# Patient Record
Sex: Male | Born: 2000 | Race: Black or African American | Hispanic: No | Marital: Single | Smoking: Never smoker
Health system: Southern US, Community
[De-identification: ages and names within clinical notes are randomized; demographics above are authoritative.]

## PROBLEM LIST (undated history)

## (undated) DIAGNOSIS — Y249XXA Unspecified firearm discharge, undetermined intent, initial encounter: Secondary | ICD-10-CM

## (undated) DIAGNOSIS — F913 Oppositional defiant disorder: Secondary | ICD-10-CM

## (undated) DIAGNOSIS — W3400XA Accidental discharge from unspecified firearms or gun, initial encounter: Secondary | ICD-10-CM

---
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
## 2021-10-01 ENCOUNTER — Encounter (HOSPITAL_COMMUNITY): Payer: Self-pay | Admitting: *Deleted

## 2021-10-01 ENCOUNTER — Ambulatory Visit (HOSPITAL_COMMUNITY)
Admission: EM | Admit: 2021-10-01 | Discharge: 2021-10-01 | Disposition: A | Discharge status: Home / Self Care | Payer: Medicaid Other

## 2021-10-01 ENCOUNTER — Other Ambulatory Visit: Payer: Self-pay

## 2021-10-01 DIAGNOSIS — R100 Acute abdomen: Secondary | ICD-10-CM | POA: Diagnosis not present

## 2021-10-01 NOTE — ED Triage Notes (Signed)
Pt reports ABD pain and vomiting started this morning.
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
## 2021-10-01 NOTE — Discharge Instructions (Addendum)
-  I'm worried about appendicitis, which is potentially a surgical emergency. Head straight to the ED.

## 2021-10-17 ENCOUNTER — Ambulatory Visit (HOSPITAL_COMMUNITY)
Admission: EM | Admit: 2021-10-17 | Discharge: 2021-10-17 | Disposition: A | Discharge status: Home / Self Care | Payer: Medicaid Other | Attending: Family Medicine | Admitting: Family Medicine

## 2021-10-17 ENCOUNTER — Encounter (HOSPITAL_COMMUNITY): Payer: Self-pay

## 2021-10-17 DIAGNOSIS — R21 Rash and other nonspecific skin eruption: Secondary | ICD-10-CM | POA: Diagnosis not present

## 2021-10-17 DIAGNOSIS — Z113 Encounter for screening for infections with a predominantly sexual mode of transmission: Secondary | ICD-10-CM | POA: Diagnosis not present

## 2021-10-17 MED ORDER — CLOTRIMAZOLE 1 % EX CREA: TOPICAL_CREAM | CUTANEOUS | 0 refills | Status: DC

## 2021-10-17 NOTE — ED Triage Notes (Signed)
Pt presents for STI testing. He reports a itchy red rash x 2 days.

## 2021-10-17 NOTE — Discharge Instructions (Signed)
You were seen today for a rash.  I have sent out a cream for you to use twice/day.  You may also purchase over the counter cortisone cream to use on the rash sparingly, no more than twice/day x 3 days.  Your STD screening will be resulted tomorrow and you will be notified if any treatment is needed.
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
## 2021-10-18 LAB — CYTOLOGY, (ORAL, ANAL, URETHRAL) ANCILLARY ONLY
Chlamydia: NEGATIVE
Comment: NEGATIVE
Comment: NEGATIVE
Comment: NORMAL
Neisseria Gonorrhea: NEGATIVE
Trichomonas: NEGATIVE
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
## 2023-08-11 ENCOUNTER — Inpatient Hospital Stay (HOSPITAL_COMMUNITY): Payer: MEDICAID

## 2023-08-11 ENCOUNTER — Emergency Department (HOSPITAL_COMMUNITY): Payer: MEDICAID

## 2023-08-11 ENCOUNTER — Inpatient Hospital Stay (HOSPITAL_COMMUNITY)
Admission: EM | Admit: 2023-08-11 | Discharge: 2023-08-17 | DRG: 958 | Payer: MEDICAID | Attending: Surgery | Admitting: Surgery

## 2023-08-11 ENCOUNTER — Emergency Department (HOSPITAL_COMMUNITY): Payer: MEDICAID | Admitting: Anesthesiology

## 2023-08-11 ENCOUNTER — Other Ambulatory Visit: Payer: Self-pay

## 2023-08-11 ENCOUNTER — Encounter (HOSPITAL_COMMUNITY): Payer: Self-pay

## 2023-08-11 ENCOUNTER — Encounter (HOSPITAL_COMMUNITY): Admission: EM | Payer: Self-pay | Source: Home / Self Care

## 2023-08-11 DIAGNOSIS — S36118A Other injury of liver, initial encounter: Secondary | ICD-10-CM | POA: Diagnosis present

## 2023-08-11 DIAGNOSIS — D62 Acute posthemorrhagic anemia: Secondary | ICD-10-CM | POA: Diagnosis present

## 2023-08-11 DIAGNOSIS — S31132A Puncture wound of abdominal wall without foreign body, epigastric region without penetration into peritoneal cavity, initial encounter: Secondary | ICD-10-CM | POA: Diagnosis present

## 2023-08-11 DIAGNOSIS — S36593A Other injury of sigmoid colon, initial encounter: Principal | ICD-10-CM | POA: Diagnosis present

## 2023-08-11 DIAGNOSIS — W3400XA Accidental discharge from unspecified firearms or gun, initial encounter: Secondary | ICD-10-CM | POA: Diagnosis not present

## 2023-08-11 DIAGNOSIS — S31109A Unspecified open wound of abdominal wall, unspecified quadrant without penetration into peritoneal cavity, initial encounter: Principal | ICD-10-CM

## 2023-08-11 DIAGNOSIS — S32302A Unspecified fracture of left ilium, initial encounter for closed fracture: Secondary | ICD-10-CM | POA: Diagnosis present

## 2023-08-11 DIAGNOSIS — Y249XXA Unspecified firearm discharge, undetermined intent, initial encounter: Secondary | ICD-10-CM

## 2023-08-11 HISTORY — PX: COLOSTOMY: SHX63

## 2023-08-11 HISTORY — DX: Accidental discharge from unspecified firearms or gun, initial encounter: W34.00XA

## 2023-08-11 HISTORY — PX: COLON RESECTION SIGMOID: SHX6737

## 2023-08-11 HISTORY — PX: LAPAROTOMY: SHX154

## 2023-08-11 HISTORY — DX: Oppositional defiant disorder: F91.3

## 2023-08-11 HISTORY — DX: Unspecified firearm discharge, undetermined intent, initial encounter: Y24.9XXA

## 2023-08-11 LAB — CBC
HCT: 40.8 % (ref 39.0–52.0)
Hemoglobin: 13.4 g/dL (ref 13.0–17.0)
MCH: 30.5 pg (ref 26.0–34.0)
MCHC: 32.8 g/dL (ref 30.0–36.0)
MCV: 92.7 fL (ref 80.0–100.0)
Platelets: 462 10*3/uL — ABNORMAL HIGH (ref 150–400)
RBC: 4.4 MIL/uL (ref 4.22–5.81)
RDW: 11.1 % — ABNORMAL LOW (ref 11.5–15.5)
WBC: 16.2 10*3/uL — ABNORMAL HIGH (ref 4.0–10.5)
nRBC: 0 % (ref 0.0–0.2)

## 2023-08-11 LAB — COMPREHENSIVE METABOLIC PANEL
ALT: 18 U/L (ref 0–44)
AST: 40 U/L (ref 15–41)
Albumin: 3.9 g/dL (ref 3.5–5.0)
Alkaline Phosphatase: 63 U/L (ref 38–126)
Anion gap: 16 — ABNORMAL HIGH (ref 5–15)
BUN: 8 mg/dL (ref 6–20)
CO2: 18 mmol/L — ABNORMAL LOW (ref 22–32)
Calcium: 9 mg/dL (ref 8.9–10.3)
Chloride: 103 mmol/L (ref 98–111)
Creatinine, Ser: 1.03 mg/dL (ref 0.61–1.24)
GFR, Estimated: >60 mL/min (ref >60)
Glucose, Bld: 180 mg/dL — ABNORMAL HIGH (ref 70–99)
Potassium: 3.9 mmol/L (ref 3.5–5.1)
Sodium: 137 mmol/L (ref 135–145)
Total Bilirubin: 1.1 mg/dL (ref <1.2)
Total Protein: 6.6 g/dL (ref 6.5–8.1)

## 2023-08-11 LAB — I-STAT CG4 LACTIC ACID, ED: Lactic Acid, Venous: 7.9 mmol/L (ref 0.5–1.9)

## 2023-08-11 LAB — HIV ANTIBODY (ROUTINE TESTING W REFLEX): HIV Screen 4th Generation wRfx: NONREACTIVE

## 2023-08-11 LAB — POCT I-STAT EG7
Acid-base deficit: 3 mmol/L — ABNORMAL HIGH (ref 0.0–2.0)
Bicarbonate: 21.5 mmol/L (ref 20.0–28.0)
Calcium, Ion: 1.13 mmol/L — ABNORMAL LOW (ref 1.15–1.40)
HCT: 36 % — ABNORMAL LOW (ref 39.0–52.0)
Hemoglobin: 12.2 g/dL — ABNORMAL LOW (ref 13.0–17.0)
O2 Saturation: 99 %
Potassium: 4 mmol/L (ref 3.5–5.1)
Sodium: 138 mmol/L (ref 135–145)
TCO2: 23 mmol/L (ref 22–32)
pCO2, Ven: 36.7 mmHg — ABNORMAL LOW (ref 44–60)
pH, Ven: 7.376 (ref 7.25–7.43)
pO2, Ven: 155 mmHg — ABNORMAL HIGH (ref 32–45)

## 2023-08-11 LAB — ABO/RH: ABO/RH(D): AB POS

## 2023-08-11 LAB — CBG MONITORING, ED: Glucose-Capillary: 137 mg/dL — ABNORMAL HIGH (ref 70–99)

## 2023-08-11 LAB — I-STAT CHEM 8, ED
BUN: 9 mg/dL (ref 6–20)
Calcium, Ion: 1.01 mmol/L — ABNORMAL LOW (ref 1.15–1.40)
Chloride: 103 mmol/L (ref 98–111)
Creatinine, Ser: 0.9 mg/dL (ref 0.61–1.24)
Glucose, Bld: 180 mg/dL — ABNORMAL HIGH (ref 70–99)
HCT: 41 % (ref 39.0–52.0)
Hemoglobin: 13.9 g/dL (ref 13.0–17.0)
Potassium: 4.8 mmol/L (ref 3.5–5.1)
Sodium: 138 mmol/L (ref 135–145)
TCO2: 22 mmol/L (ref 22–32)

## 2023-08-11 LAB — PROTIME-INR
INR: 1.3 — ABNORMAL HIGH (ref 0.8–1.2)
Prothrombin Time: 15.9 s — ABNORMAL HIGH (ref 11.4–15.2)

## 2023-08-11 LAB — ETHANOL: Alcohol, Ethyl (B): <10 mg/dL (ref <10)

## 2023-08-11 SURGERY — LAPAROTOMY, EXPLORATORY
Anesthesia: General

## 2023-08-11 MED ORDER — HYDROMORPHONE HCL 1 MG/ML IJ SOLN
INTRAMUSCULAR | Status: AC
  Filled 2023-08-11: qty 1

## 2023-08-11 MED ORDER — SUCCINYLCHOLINE CHLORIDE 200 MG/10ML IV SOSY
PREFILLED_SYRINGE | INTRAVENOUS | Status: AC
  Filled 2023-08-11: qty 10

## 2023-08-11 MED ORDER — HYDROMORPHONE HCL 1 MG/ML IJ SOLN
INTRAMUSCULAR | Status: AC
  Filled 2023-08-11: qty 0.5

## 2023-08-11 MED ORDER — LIDOCAINE 2% (20 MG/ML) 5 ML SYRINGE
INTRAMUSCULAR | Status: DC | PRN
  Administered 2023-08-11: 100 mg via INTRAVENOUS

## 2023-08-11 MED ORDER — LIDOCAINE 2% (20 MG/ML) 5 ML SYRINGE
INTRAMUSCULAR | Status: AC
  Filled 2023-08-11: qty 5

## 2023-08-11 MED ORDER — ONDANSETRON HCL 4 MG/2ML IJ SOLN: 4.0000 mg | Freq: Once | INTRAMUSCULAR | Status: DC | PRN

## 2023-08-11 MED ORDER — PIPERACILLIN-TAZOBACTAM 3.375 G IVPB 30 MIN
3.3750 g | Freq: Once | INTRAVENOUS | Status: AC
  Administered 2023-08-11: 3.375 g via INTRAVENOUS

## 2023-08-11 MED ORDER — SODIUM CHLORIDE 0.9 % IV SOLN: INTRAVENOUS | Status: DC | PRN

## 2023-08-11 MED ORDER — METHOCARBAMOL 500 MG PO TABS
500.0000 mg | ORAL_TABLET | Freq: Three times a day (TID) | ORAL | Status: AC
  Administered 2023-08-13: 500 mg via ORAL
  Filled 2023-08-11: qty 1

## 2023-08-11 MED ORDER — IOHEXOL 350 MG/ML SOLN
75.0000 mL | Freq: Once | INTRAVENOUS | Status: AC | PRN
  Administered 2023-08-11: 75 mL via INTRAVENOUS

## 2023-08-11 MED ORDER — HYDROMORPHONE HCL 1 MG/ML IJ SOLN
0.2500 mg | INTRAMUSCULAR | Status: DC | PRN
  Administered 2023-08-11 (×4): 0.5 mg via INTRAVENOUS

## 2023-08-11 MED ORDER — PHENYLEPHRINE 80 MCG/ML (10ML) SYRINGE FOR IV PUSH (FOR BLOOD PRESSURE SUPPORT)
PREFILLED_SYRINGE | INTRAVENOUS | Status: DC | PRN
  Administered 2023-08-11 (×3): 160 ug via INTRAVENOUS

## 2023-08-11 MED ORDER — KETAMINE HCL 50 MG/5ML IJ SOSY
PREFILLED_SYRINGE | INTRAMUSCULAR | Status: DC | PRN
  Administered 2023-08-11: 30 mg via INTRAVENOUS
  Administered 2023-08-11: 10 mg via INTRAVENOUS

## 2023-08-11 MED ORDER — ONDANSETRON 4 MG PO TBDP: 4.0000 mg | ORAL_TABLET | Freq: Four times a day (QID) | ORAL | Status: DC | PRN

## 2023-08-11 MED ORDER — CARMEX CLASSIC LIP BALM EX OINT
1.0000 | TOPICAL_OINTMENT | Freq: Once | CUTANEOUS | Status: DC
  Filled 2023-08-11: qty 10

## 2023-08-11 MED ORDER — METHOCARBAMOL 1000 MG/10ML IJ SOLN
500.0000 mg | Freq: Three times a day (TID) | INTRAMUSCULAR | Status: AC
  Administered 2023-08-11 – 2023-08-14 (×8): 500 mg via INTRAVENOUS
  Filled 2023-08-11 (×9): qty 10

## 2023-08-11 MED ORDER — PROPOFOL 10 MG/ML IV BOLUS
INTRAVENOUS | Status: DC | PRN
  Administered 2023-08-11: 120 mg via INTRAVENOUS

## 2023-08-11 MED ORDER — FENTANYL CITRATE PF 50 MCG/ML IJ SOSY: 50.0000 ug | PREFILLED_SYRINGE | Freq: Once | INTRAMUSCULAR | Status: AC

## 2023-08-11 MED ORDER — CEFAZOLIN SODIUM-DEXTROSE 2-4 GM/100ML-% IV SOLN: 2.0000 g | Freq: Once | INTRAVENOUS | Status: DC

## 2023-08-11 MED ORDER — PIPERACILLIN-TAZOBACTAM 3.375 G IVPB
3.3750 g | Freq: Three times a day (TID) | INTRAVENOUS | Status: AC
  Administered 2023-08-11 – 2023-08-14 (×11): 3.375 g via INTRAVENOUS
  Filled 2023-08-11 (×11): qty 50

## 2023-08-11 MED ORDER — MORPHINE SULFATE (PF) 4 MG/ML IV SOLN
4.0000 mg | INTRAVENOUS | Status: DC | PRN
  Administered 2023-08-11 – 2023-08-13 (×10): 4 mg via INTRAVENOUS
  Filled 2023-08-11 (×10): qty 1

## 2023-08-11 MED ORDER — LACTATED RINGERS IV SOLN: INTRAVENOUS | Status: DC | PRN

## 2023-08-11 MED ORDER — MORPHINE SULFATE (PF) 2 MG/ML IV SOLN
2.0000 mg | INTRAVENOUS | Status: DC | PRN
  Administered 2023-08-13: 2 mg via INTRAVENOUS
  Filled 2023-08-11 (×2): qty 1

## 2023-08-11 MED ORDER — TETANUS-DIPHTH-ACELL PERTUSSIS 5-2.5-18.5 LF-MCG/0.5 IM SUSY: 0.5000 mL | PREFILLED_SYRINGE | Freq: Once | INTRAMUSCULAR | Status: DC

## 2023-08-11 MED ORDER — DEXAMETHASONE SODIUM PHOSPHATE 10 MG/ML IJ SOLN
INTRAMUSCULAR | Status: DC | PRN
  Administered 2023-08-11: 4 mg via INTRAVENOUS

## 2023-08-11 MED ORDER — KETOROLAC TROMETHAMINE 30 MG/ML IJ SOLN
30.0000 mg | Freq: Four times a day (QID) | INTRAMUSCULAR | Status: AC
  Administered 2023-08-11 – 2023-08-14 (×11): 30 mg via INTRAVENOUS
  Filled 2023-08-11 (×13): qty 1

## 2023-08-11 MED ORDER — SUGAMMADEX SODIUM 200 MG/2ML IV SOLN
INTRAVENOUS | Status: DC | PRN
  Administered 2023-08-11: 100 mg via INTRAVENOUS

## 2023-08-11 MED ORDER — ONDANSETRON HCL 4 MG/2ML IJ SOLN
4.0000 mg | Freq: Four times a day (QID) | INTRAMUSCULAR | Status: DC | PRN
  Administered 2023-08-11 – 2023-08-13 (×6): 4 mg via INTRAVENOUS
  Filled 2023-08-11 (×8): qty 2

## 2023-08-11 MED ORDER — METOPROLOL TARTRATE 5 MG/5ML IV SOLN: 5.0000 mg | Freq: Four times a day (QID) | INTRAVENOUS | Status: DC | PRN

## 2023-08-11 MED ORDER — PHENOL 1.4 % MT LIQD
1.0000 | OROMUCOSAL | Status: DC | PRN
  Administered 2023-08-11: 1 via OROMUCOSAL
  Filled 2023-08-11 (×2): qty 177

## 2023-08-11 MED ORDER — HYDRALAZINE HCL 20 MG/ML IJ SOLN: 10.0000 mg | INTRAMUSCULAR | Status: DC | PRN

## 2023-08-11 MED ORDER — MORPHINE SULFATE (PF) 2 MG/ML IV SOLN: 1.0000 mg | INTRAVENOUS | Status: DC | PRN

## 2023-08-11 MED ORDER — MIDAZOLAM HCL 2 MG/2ML IJ SOLN
INTRAMUSCULAR | Status: AC
  Filled 2023-08-11: qty 2

## 2023-08-11 MED ORDER — KCL IN DEXTROSE-NACL 20-5-0.45 MEQ/L-%-% IV SOLN
INTRAVENOUS | Status: DC
  Filled 2023-08-11 (×5): qty 1000

## 2023-08-11 MED ORDER — ENOXAPARIN SODIUM 30 MG/0.3ML IJ SOSY
30.0000 mg | PREFILLED_SYRINGE | Freq: Two times a day (BID) | INTRAMUSCULAR | Status: DC
  Filled 2023-08-11: qty 0.3

## 2023-08-11 MED ORDER — ACETAMINOPHEN 10 MG/ML IV SOLN
1000.0000 mg | Freq: Four times a day (QID) | INTRAVENOUS | Status: AC
  Administered 2023-08-11 – 2023-08-12 (×4): 1000 mg via INTRAVENOUS
  Filled 2023-08-11 (×4): qty 100

## 2023-08-11 MED ORDER — PANTOPRAZOLE SODIUM 40 MG IV SOLR
40.0000 mg | Freq: Every day | INTRAVENOUS | Status: DC
  Administered 2023-08-11 – 2023-08-14 (×4): 40 mg via INTRAVENOUS
  Filled 2023-08-11 (×4): qty 10

## 2023-08-11 MED ORDER — FENTANYL CITRATE PF 50 MCG/ML IJ SOSY
PREFILLED_SYRINGE | INTRAMUSCULAR | Status: AC
  Administered 2023-08-11: 50 ug via INTRAVENOUS
  Filled 2023-08-11: qty 1

## 2023-08-11 MED ORDER — MIDAZOLAM HCL 2 MG/2ML IJ SOLN
INTRAMUSCULAR | Status: DC | PRN
  Administered 2023-08-11: 2 mg via INTRAVENOUS

## 2023-08-11 MED ORDER — FENTANYL CITRATE (PF) 250 MCG/5ML IJ SOLN
INTRAMUSCULAR | Status: AC
  Filled 2023-08-11: qty 5

## 2023-08-11 MED ORDER — ONDANSETRON HCL 4 MG/2ML IJ SOLN
4.0000 mg | Freq: Once | INTRAMUSCULAR | Status: AC
  Administered 2023-08-11: 4 mg via INTRAVENOUS

## 2023-08-11 MED ORDER — ONDANSETRON HCL 4 MG/2ML IJ SOLN
INTRAMUSCULAR | Status: DC | PRN
  Administered 2023-08-11: 4 mg via INTRAVENOUS

## 2023-08-11 MED ORDER — PANTOPRAZOLE SODIUM 40 MG PO TBEC
40.0000 mg | DELAYED_RELEASE_TABLET | Freq: Every day | ORAL | Status: DC
  Administered 2023-08-15 – 2023-08-17 (×3): 40 mg via ORAL
  Filled 2023-08-11 (×4): qty 1

## 2023-08-11 MED ORDER — PHENYLEPHRINE 80 MCG/ML (10ML) SYRINGE FOR IV PUSH (FOR BLOOD PRESSURE SUPPORT)
PREFILLED_SYRINGE | INTRAVENOUS | Status: AC
  Filled 2023-08-11: qty 10

## 2023-08-11 MED ORDER — FENTANYL CITRATE (PF) 250 MCG/5ML IJ SOLN
INTRAMUSCULAR | Status: DC | PRN
  Administered 2023-08-11 (×2): 50 ug via INTRAVENOUS
  Administered 2023-08-11: 100 ug via INTRAVENOUS

## 2023-08-11 MED ORDER — SUCCINYLCHOLINE CHLORIDE 200 MG/10ML IV SOSY
PREFILLED_SYRINGE | INTRAVENOUS | Status: DC | PRN
  Administered 2023-08-11: 100 mg via INTRAVENOUS

## 2023-08-11 MED ORDER — ROCURONIUM BROMIDE 10 MG/ML (PF) SYRINGE
PREFILLED_SYRINGE | INTRAVENOUS | Status: DC | PRN
  Administered 2023-08-11: 40 mg via INTRAVENOUS

## 2023-08-11 MED ORDER — ORAL CARE MOUTH RINSE: 15.0000 mL | OROMUCOSAL | Status: DC | PRN

## 2023-08-11 MED ORDER — HYDROMORPHONE HCL 1 MG/ML IJ SOLN
INTRAMUSCULAR | Status: DC | PRN
  Administered 2023-08-11 (×2): .25 mg via INTRAVENOUS

## 2023-08-11 MED ORDER — PROPOFOL 10 MG/ML IV BOLUS
INTRAVENOUS | Status: AC
Start: 2023-08-11 — End: ?
  Filled 2023-08-11: qty 20

## 2023-08-11 MED ORDER — ONDANSETRON HCL 4 MG/2ML IJ SOLN
INTRAMUSCULAR | Status: AC
Start: 2023-08-11 — End: ?
  Filled 2023-08-11: qty 2

## 2023-08-11 MED ORDER — ROCURONIUM BROMIDE 10 MG/ML (PF) SYRINGE
PREFILLED_SYRINGE | INTRAVENOUS | Status: AC
Start: 2023-08-11 — End: ?
  Filled 2023-08-11: qty 10

## 2023-08-11 MED ORDER — ALBUMIN HUMAN 5 % IV SOLN
INTRAVENOUS | Status: DC | PRN
Start: 2023-08-11 — End: 2023-08-11

## 2023-08-11 MED ORDER — KETAMINE HCL 50 MG/5ML IJ SOSY
PREFILLED_SYRINGE | INTRAMUSCULAR | Status: AC
Start: 2023-08-11 — End: ?
  Filled 2023-08-11: qty 5

## 2023-08-11 SURGICAL SUPPLY — 41 items
BAG COUNTER SPONGE SURGICOUNT (BAG) ×2 IMPLANT
BLADE CLIPPER SURG (BLADE) IMPLANT
CANISTER SUCT 3000ML PPV (MISCELLANEOUS) ×2 IMPLANT
CHLORAPREP W/TINT 26 (MISCELLANEOUS) ×2 IMPLANT
COVER SURGICAL LIGHT HANDLE (MISCELLANEOUS) ×2 IMPLANT
DRAPE LAPAROSCOPIC ABDOMINAL (DRAPES) ×2 IMPLANT
DRAPE WARM FLUID 44X44 (DRAPES) ×2 IMPLANT
DRSG OPSITE POSTOP 4X10 (GAUZE/BANDAGES/DRESSINGS) IMPLANT
DRSG OPSITE POSTOP 4X8 (GAUZE/BANDAGES/DRESSINGS) IMPLANT
ELECT BLADE 6.5 EXT (BLADE) IMPLANT
ELECT CAUTERY BLADE 6.4 (BLADE) ×2 IMPLANT
ELECT REM PT RETURN 9FT ADLT (ELECTROSURGICAL) ×2 IMPLANT
ELECTRODE REM PT RTRN 9FT ADLT (ELECTROSURGICAL) ×2 IMPLANT
GLOVE BIO SURGEON STRL SZ8 (GLOVE) ×2 IMPLANT
GLOVE BIOGEL PI IND STRL 8 (GLOVE) ×2 IMPLANT
GOWN STRL REUS W/ TWL LRG LVL3 (GOWN DISPOSABLE) ×2 IMPLANT
GOWN STRL REUS W/ TWL XL LVL3 (GOWN DISPOSABLE) ×2 IMPLANT
HANDLE SUCTION POOLE (INSTRUMENTS) ×2 IMPLANT
KIT BASIN OR (CUSTOM PROCEDURE TRAY) ×2 IMPLANT
KIT OSTOMY DRAINABLE 2.75 STR (WOUND CARE) IMPLANT
KIT TURNOVER KIT B (KITS) ×2 IMPLANT
LIGASURE IMPACT 36 18CM CVD LR (INSTRUMENTS) IMPLANT
NS IRRIG 1000ML POUR BTL (IV SOLUTION) ×4 IMPLANT
PACK GENERAL/GYN (CUSTOM PROCEDURE TRAY) ×2 IMPLANT
PAD ARMBOARD 7.5X6 YLW CONV (MISCELLANEOUS) ×2 IMPLANT
RELOAD PROXIMATE 75MM BLUE (ENDOMECHANICALS) ×2 IMPLANT
RELOAD STAPLE 75 3.8 BLU REG (ENDOMECHANICALS) IMPLANT
SPECIMEN JAR LARGE (MISCELLANEOUS) IMPLANT
SPONGE T-LAP 18X18 ~~LOC~~+RFID (SPONGE) IMPLANT
STAPLER PROXIMATE 75MM BLUE (STAPLE) IMPLANT
STAPLER VISISTAT 35W (STAPLE) ×2 IMPLANT
SUCTION POOLE HANDLE (INSTRUMENTS) ×2 IMPLANT
SUT PDS AB 1 TP1 96 (SUTURE) ×4 IMPLANT
SUT SILK 2 0 SH CR/8 (SUTURE) ×2 IMPLANT
SUT SILK 2 0 TIES 10X30 (SUTURE) ×2 IMPLANT
SUT SILK 3 0 SH CR/8 (SUTURE) ×2 IMPLANT
SUT SILK 3 0 TIES 10X30 (SUTURE) ×2 IMPLANT
SUT VIC AB 3-0 SH 8-18 (SUTURE) IMPLANT
TOWEL GREEN STERILE (TOWEL DISPOSABLE) ×2 IMPLANT
TRAY FOLEY MTR SLVR 16FR STAT (SET/KITS/TRAYS/PACK) IMPLANT
YANKAUER SUCT BULB TIP NO VENT (SUCTIONS) IMPLANT
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
## 2023-08-11 NOTE — ED Notes (Signed)
Trauma end to OR at [phone]

## 2023-08-11 NOTE — Anesthesia Procedure Notes (Signed)
Procedure Name: Intubation Date/Time: 08/11/2023 8:58 AM  Performed by: Thomasene Ripple, CRNAPre-anesthesia Checklist: Patient identified, Emergency Drugs available, Suction available and Patient being monitored Patient Re-evaluated:Patient Re-evaluated prior to induction Oxygen Delivery Method: Circle System Utilized Preoxygenation: Pre-oxygenation with 100% oxygen Induction Type: IV induction Ventilation: Mask ventilation without difficulty Laryngoscope Size: Miller and 3 Grade View: Grade I Tube type: Oral Tube size: 8.0 mm Number of attempts: 1 Airway Equipment and Method: Stylet and Oral airway Placement Confirmation: ETT inserted through vocal cords under direct vision, positive ETCO2 and breath sounds checked- equal and bilateral Secured at: 23 cm Tube secured with: Tape Dental Injury: Teeth and Oropharynx as per pre-operative assessment
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
## 2023-08-11 NOTE — Transfer of Care (Signed)
Immediate Anesthesia Transfer of Care Note  Patient: Trevor [name]  Procedure(s) Performed: EXPLORATORY LAPAROTOMY COLOSTOMY COLON RESECTION SIGMOID  Patient Location: PACU  Anesthesia Type:General  Level of Consciousness: drowsy  Airway & Oxygen Therapy: Patient Spontanous Breathing and Patient connected to face mask oxygen  Post-op Assessment: Report given to RN and Post -op Vital signs reviewed and stable  Post vital signs: Reviewed and stable  Last Vitals:  Vitals Value Taken Time  BP 125/105 08/11/23 1034  Temp 36.4 C 08/11/23 1030  Pulse 138 08/11/23 1040  Resp 32 08/11/23 1040  SpO2 97 % 08/11/23 1040  Vitals shown include unfiled device data.  Last Pain:  Vitals:   08/11/23 1030  TempSrc:   PainSc: Asleep         Complications: No notable events documented.
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
## 2023-08-11 NOTE — ED Notes (Signed)
FAST exam Dr. Andria Meuse negative
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
## 2023-08-11 NOTE — Anesthesia Postprocedure Evaluation (Signed)
Anesthesia Post Note  Patient: Trevor [name]  Procedure(s) Performed: EXPLORATORY LAPAROTOMY COLOSTOMY COLON RESECTION SIGMOID     Patient location during evaluation: PACU Anesthesia Type: General Level of consciousness: awake and alert Pain management: pain level controlled Vital Signs Assessment: post-procedure vital signs reviewed and stable Respiratory status: spontaneous breathing, nonlabored ventilation, respiratory function stable and patient connected to nasal cannula oxygen Cardiovascular status: blood pressure returned to baseline and stable Postop Assessment: no apparent nausea or vomiting Anesthetic complications: no  No notable events documented.  Last Vitals:  Vitals:   08/11/23 1200 08/11/23 1215  BP: 135/82 121/81  Pulse: (!) 128 (!) 125  Resp: 20 18  Temp:    SpO2: 94% 94%    Last Pain:  Vitals:   08/11/23 1145  TempSrc:   PainSc: Asleep                 [name]
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
## 2023-08-11 NOTE — ED Notes (Addendum)
Detective on case Public relations account executive Gara Kroner [phone]
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
## 2023-08-11 NOTE — ED Notes (Signed)
BS = 137
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
## 2023-08-11 NOTE — ED Notes (Signed)
Blood sent to lab
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
## 2023-08-12 ENCOUNTER — Inpatient Hospital Stay (HOSPITAL_COMMUNITY): Payer: MEDICAID

## 2023-08-12 ENCOUNTER — Encounter (HOSPITAL_COMMUNITY): Payer: Self-pay | Admitting: General Surgery

## 2023-08-12 LAB — CBC
HCT: 35.1 % — ABNORMAL LOW (ref 39.0–52.0)
Hemoglobin: 11.7 g/dL — ABNORMAL LOW (ref 13.0–17.0)
MCH: 30.9 pg (ref 26.0–34.0)
MCHC: 33.3 g/dL (ref 30.0–36.0)
MCV: 92.6 fL (ref 80.0–100.0)
Platelets: 347 10*3/uL (ref 150–400)
RBC: 3.79 MIL/uL — ABNORMAL LOW (ref 4.22–5.81)
RDW: 11.4 % — ABNORMAL LOW (ref 11.5–15.5)
WBC: 20.3 10*3/uL — ABNORMAL HIGH (ref 4.0–10.5)
nRBC: 0 % (ref 0.0–0.2)

## 2023-08-12 LAB — BASIC METABOLIC PANEL
Anion gap: 8 (ref 5–15)
BUN: 8 mg/dL (ref 6–20)
CO2: 23 mmol/L (ref 22–32)
Calcium: 8.8 mg/dL — ABNORMAL LOW (ref 8.9–10.3)
Chloride: 104 mmol/L (ref 98–111)
Creatinine, Ser: 0.89 mg/dL (ref 0.61–1.24)
GFR, Estimated: >60 mL/min (ref >60)
Glucose, Bld: 141 mg/dL — ABNORMAL HIGH (ref 70–99)
Potassium: 4.2 mmol/L (ref 3.5–5.1)
Sodium: 135 mmol/L (ref 135–145)

## 2023-08-12 LAB — SURGICAL PATHOLOGY

## 2023-08-12 MED ORDER — PROCHLORPERAZINE EDISYLATE 10 MG/2ML IJ SOLN
10.0000 mg | Freq: Four times a day (QID) | INTRAMUSCULAR | Status: DC | PRN
  Administered 2023-08-12 (×3): 10 mg via INTRAVENOUS
  Filled 2023-08-12 (×3): qty 2

## 2023-08-12 MED ORDER — ENOXAPARIN SODIUM 30 MG/0.3ML IJ SOSY
30.0000 mg | PREFILLED_SYRINGE | Freq: Two times a day (BID) | INTRAMUSCULAR | Status: DC
  Administered 2023-08-13 – 2023-08-14 (×3): 30 mg via SUBCUTANEOUS
  Filled 2023-08-12 (×3): qty 0.3

## 2023-08-12 MED ORDER — SILVER NITRATE-POT NITRATE 75-25 % EX MISC
1.0000 | CUTANEOUS | Status: AC
  Administered 2023-08-12: 1 via TOPICAL
  Filled 2023-08-12: qty 1

## 2023-08-12 MED ORDER — ACETAMINOPHEN 10 MG/ML IV SOLN
1000.0000 mg | Freq: Four times a day (QID) | INTRAVENOUS | Status: AC
  Administered 2023-08-12 – 2023-08-13 (×4): 1000 mg via INTRAVENOUS
  Filled 2023-08-12 (×4): qty 100
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
## 2023-08-12 NOTE — Consult Note (Signed)
WOC Nurse ostomy consult note Stoma type/location: LLQ colostomy pink moist budded.  Nonproductive at this time.  Laparotomy was saturated and pouch was change by bedside RN not long ago this morning.  Patient is receiving assistance with bathing and linen change as they have serosanguinous effluent present Stomal assessment/size: 2" edematous pink and moist  Peristomal assessment: pouch intact  midline incision with moderate serosanguinous drainage Treatment options for stomal/peristomal skin: barrier ring and 1 piece flat pouch Output none at this time Ostomy pouching: 1pc. Pouch with barrier ring Education provided: Written materials left at bedside .  Will try pouch change again Friday.  Enrolled patient in [employer] DC program: no Will follow Mike Gip MSN, RN, FNP-BC [facility] Wound, Ostomy, Continence Nurse [facility]patient [facility] [phone] Pager [phone]
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
## 2023-08-13 LAB — CBC
HCT: 25.5 % — ABNORMAL LOW (ref 39.0–52.0)
Hemoglobin: 8.9 g/dL — ABNORMAL LOW (ref 13.0–17.0)
MCH: 31.1 pg (ref 26.0–34.0)
MCHC: 34.9 g/dL (ref 30.0–36.0)
MCV: 89.2 fL (ref 80.0–100.0)
Platelets: 289 10*3/uL (ref 150–400)
RBC: 2.86 MIL/uL — ABNORMAL LOW (ref 4.22–5.81)
RDW: 11.3 % — ABNORMAL LOW (ref 11.5–15.5)
WBC: 19 10*3/uL — ABNORMAL HIGH (ref 4.0–10.5)
nRBC: 0 % (ref 0.0–0.2)

## 2023-08-13 LAB — BASIC METABOLIC PANEL
Anion gap: 4 — ABNORMAL LOW (ref 5–15)
BUN: 8 mg/dL (ref 6–20)
CO2: 29 mmol/L (ref 22–32)
Calcium: 8.6 mg/dL — ABNORMAL LOW (ref 8.9–10.3)
Chloride: 99 mmol/L (ref 98–111)
Creatinine, Ser: 0.92 mg/dL (ref 0.61–1.24)
GFR, Estimated: >60 mL/min (ref >60)
Glucose, Bld: 128 mg/dL — ABNORMAL HIGH (ref 70–99)
Potassium: 4.3 mmol/L (ref 3.5–5.1)
Sodium: 132 mmol/L — ABNORMAL LOW (ref 135–145)

## 2023-08-13 MED ORDER — ACETAMINOPHEN 10 MG/ML IV SOLN
1000.0000 mg | Freq: Four times a day (QID) | INTRAVENOUS | Status: AC
  Administered 2023-08-13 – 2023-08-14 (×3): 1000 mg via INTRAVENOUS
  Filled 2023-08-13 (×4): qty 100

## 2023-08-13 MED ORDER — DEXTROSE-SODIUM CHLORIDE 5-0.9 % IV SOLN: INTRAVENOUS | Status: AC

## 2023-08-13 MED ORDER — BACITRACIN ZINC 500 UNIT/GM EX OINT: 1.0000 | TOPICAL_OINTMENT | Freq: Three times a day (TID) | CUTANEOUS | Status: DC | PRN

## 2023-08-13 MED ORDER — MELATONIN 3 MG PO TABS
3.0000 mg | ORAL_TABLET | Freq: Every evening | ORAL | Status: DC | PRN
  Administered 2023-08-13 – 2023-08-16 (×4): 3 mg via ORAL
  Filled 2023-08-13 (×4): qty 1
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
## 2023-08-14 LAB — CBC
HCT: 17.8 % — ABNORMAL LOW (ref 39.0–52.0)
HCT: 26.6 % — ABNORMAL LOW (ref 39.0–52.0)
Hemoglobin: 6.1 g/dL — CL (ref 13.0–17.0)
Hemoglobin: 9.1 g/dL — ABNORMAL LOW (ref 13.0–17.0)
MCH: 31 pg (ref 26.0–34.0)
MCH: 30.1 pg (ref 26.0–34.0)
MCHC: 34.3 g/dL (ref 30.0–36.0)
MCHC: 34.2 g/dL (ref 30.0–36.0)
MCV: 90.4 fL (ref 80.0–100.0)
MCV: 88.1 fL (ref 80.0–100.0)
Platelets: 235 10*3/uL (ref 150–400)
Platelets: 293 10*3/uL (ref 150–400)
RBC: 1.97 MIL/uL — ABNORMAL LOW (ref 4.22–5.81)
RBC: 3.02 MIL/uL — ABNORMAL LOW (ref 4.22–5.81)
RDW: 11.6 % (ref 11.5–15.5)
RDW: 13.5 % (ref 11.5–15.5)
WBC: 11.1 10*3/uL — ABNORMAL HIGH (ref 4.0–10.5)
WBC: 13 10*3/uL — ABNORMAL HIGH (ref 4.0–10.5)
nRBC: 0 % (ref 0.0–0.2)
nRBC: 0 % (ref 0.0–0.2)

## 2023-08-14 LAB — BASIC METABOLIC PANEL
Anion gap: 7 (ref 5–15)
BUN: 11 mg/dL (ref 6–20)
CO2: 24 mmol/L (ref 22–32)
Calcium: 7.8 mg/dL — ABNORMAL LOW (ref 8.9–10.3)
Chloride: 104 mmol/L (ref 98–111)
Creatinine, Ser: 0.81 mg/dL (ref 0.61–1.24)
GFR, Estimated: >60 mL/min (ref >60)
Glucose, Bld: 89 mg/dL (ref 70–99)
Potassium: 3.5 mmol/L (ref 3.5–5.1)
Sodium: 135 mmol/L (ref 135–145)

## 2023-08-14 LAB — PREPARE RBC (CROSSMATCH)

## 2023-08-14 MED ORDER — OXYCODONE HCL 5 MG PO TABS
5.0000 mg | ORAL_TABLET | ORAL | Status: DC | PRN
  Administered 2023-08-14 (×2): 5 mg via ORAL
  Administered 2023-08-15 – 2023-08-16 (×6): 10 mg via ORAL
  Filled 2023-08-14 (×7): qty 2
  Filled 2023-08-14: qty 1

## 2023-08-14 MED ORDER — METHOCARBAMOL 1000 MG/10ML IJ SOLN: 500.0000 mg | Freq: Three times a day (TID) | INTRAMUSCULAR | Status: DC

## 2023-08-14 MED ORDER — ACETAMINOPHEN 500 MG PO TABS
1000.0000 mg | ORAL_TABLET | Freq: Four times a day (QID) | ORAL | Status: DC
  Administered 2023-08-14 – 2023-08-17 (×10): 1000 mg via ORAL
  Filled 2023-08-14 (×11): qty 2

## 2023-08-14 MED ORDER — METHOCARBAMOL 500 MG PO TABS
500.0000 mg | ORAL_TABLET | Freq: Three times a day (TID) | ORAL | Status: AC
  Administered 2023-08-14 – 2023-08-17 (×8): 500 mg via ORAL
  Filled 2023-08-14 (×9): qty 1

## 2023-08-14 MED ORDER — MORPHINE SULFATE (PF) 2 MG/ML IV SOLN
2.0000 mg | INTRAVENOUS | Status: DC | PRN
  Administered 2023-08-14 – 2023-08-15 (×2): 2 mg via INTRAVENOUS
  Filled 2023-08-14 (×2): qty 1

## 2023-08-14 MED ORDER — SODIUM CHLORIDE 0.9% IV SOLUTION: Freq: Once | INTRAVENOUS | Status: DC

## 2023-08-14 MED ORDER — DEXTROSE-SODIUM CHLORIDE 5-0.9 % IV SOLN: INTRAVENOUS | Status: DC

## 2023-08-14 MED ORDER — SODIUM CHLORIDE 0.9% IV SOLUTION: Freq: Once | INTRAVENOUS | Status: AC
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
## 2023-08-14 NOTE — Progress Notes (Signed)
Date and time results received: 08/14/23 0628 (use smartphrase ".now" to insert current time)  Test: hgb Critical Value: 6.1  Name of Provider Notified: Andrey Campanile, MD  Orders Received? Or Actions Taken?: Orders for 1 unit PRBC
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
## 2023-08-14 NOTE — Consult Note (Addendum)
WOC Nurse ostomy follow up Stoma type/location: LLQ colostomy pink moist budded. Productive. Stomal assessment/size: 1 1/2 in. Pink and moisture. Peristomal assessment: Skin intact Treatment options for stomal/peristomal skin:  Output 80 ml Ostomy pouching: 1pc. Education provided: Teach how to cut the barrier, put the ring on the bag system, adjust into the skin, which position is better to him. Patient was participated on the changing. He close and up the pouch with any problem.  Empty pouch when 1/3  to  full of stool/flatus Clean bottom 2-inches of fecal pouch prior to resealing Assist patient in emptying pouch Change pouch twice weekly and PRN.  Write date of pouch application on pouch. Have spare pouch at bedside at all times Use Ostomy Teaching Booklet (provided by [facility] nurse) for teaching/reinforcing patient and caregiver education regarding ostomy Use Supplies:  Pouch One piece 2 1/8 in. LAWSON # [id number] Ring LAWSON # [id number] (in supply room)  Order supplies today 12/06. Ordered convex pouch for the next change.  Enrolled patient in [place] Secure Start Discharge program: Yes  Will going to change the pouch system on Tuesday.  Thank-you,  Denyse Amass BSN, RN, [employer], WOC  (Pager: [phone])

## 2023-08-14 NOTE — Progress Notes (Signed)
This RN went into patient room to explain the blood transfusion procedure and get consent, he stated "he don't just be signing stuff" after being educated on the importance of receiving blood products he still refused to sign at the [facility] like the doctor to speak to him Will pass along to dayshift RN

## 2023-08-15 LAB — CBC
HCT: 26.8 % — ABNORMAL LOW (ref 39.0–52.0)
Hemoglobin: 9.3 g/dL — ABNORMAL LOW (ref 13.0–17.0)
MCH: 30.5 pg (ref 26.0–34.0)
MCHC: 34.7 g/dL (ref 30.0–36.0)
MCV: 87.9 fL (ref 80.0–100.0)
Platelets: 250 10*3/uL (ref 150–400)
RBC: 3.05 MIL/uL — ABNORMAL LOW (ref 4.22–5.81)
RDW: 13.4 % (ref 11.5–15.5)
WBC: 10.1 10*3/uL (ref 4.0–10.5)
nRBC: 0 % (ref 0.0–0.2)

## 2023-08-15 LAB — BPAM RBC
Blood Product Expiration Date: 202412252359
ISSUE DATE / TIME: 202412060830
Unit Type and Rh: 6200

## 2023-08-15 LAB — BASIC METABOLIC PANEL
Anion gap: 9 (ref 5–15)
BUN: 10 mg/dL (ref 6–20)
CO2: 22 mmol/L (ref 22–32)
Calcium: 8.3 mg/dL — ABNORMAL LOW (ref 8.9–10.3)
Chloride: 104 mmol/L (ref 98–111)
Creatinine, Ser: 0.87 mg/dL (ref 0.61–1.24)
GFR, Estimated: >60 mL/min (ref >60)
Glucose, Bld: 83 mg/dL (ref 70–99)
Potassium: 3.4 mmol/L — ABNORMAL LOW (ref 3.5–5.1)
Sodium: 135 mmol/L (ref 135–145)

## 2023-08-15 LAB — TYPE AND SCREEN
ABO/RH(D): AB POS
Antibody Screen: NEGATIVE
Unit division: 0

## 2023-08-15 MED ORDER — POTASSIUM CHLORIDE CRYS ER 20 MEQ PO TBCR
40.0000 meq | EXTENDED_RELEASE_TABLET | Freq: Two times a day (BID) | ORAL | Status: AC
  Administered 2023-08-15 (×2): 40 meq via ORAL
  Filled 2023-08-15 (×2): qty 2
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
## 2023-08-16 LAB — BASIC METABOLIC PANEL
Anion gap: 10 (ref 5–15)
BUN: 6 mg/dL (ref 6–20)
CO2: 23 mmol/L (ref 22–32)
Calcium: 8.2 mg/dL — ABNORMAL LOW (ref 8.9–10.3)
Chloride: 101 mmol/L (ref 98–111)
Creatinine, Ser: 0.76 mg/dL (ref 0.61–1.24)
GFR, Estimated: >60 mL/min (ref >60)
Glucose, Bld: 80 mg/dL (ref 70–99)
Potassium: 4.1 mmol/L (ref 3.5–5.1)
Sodium: 134 mmol/L — ABNORMAL LOW (ref 135–145)

## 2023-08-16 LAB — CBC
HCT: 27.3 % — ABNORMAL LOW (ref 39.0–52.0)
Hemoglobin: 9.4 g/dL — ABNORMAL LOW (ref 13.0–17.0)
MCH: 30.3 pg (ref 26.0–34.0)
MCHC: 34.4 g/dL (ref 30.0–36.0)
MCV: 88.1 fL (ref 80.0–100.0)
Platelets: 326 10*3/uL (ref 150–400)
RBC: 3.1 MIL/uL — ABNORMAL LOW (ref 4.22–5.81)
RDW: 12.4 % (ref 11.5–15.5)
WBC: 11.1 10*3/uL — ABNORMAL HIGH (ref 4.0–10.5)
nRBC: 0.2 % (ref 0.0–0.2)
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
## 2023-08-17 ENCOUNTER — Encounter (HOSPITAL_COMMUNITY): Payer: Self-pay

## 2023-08-17 MED ORDER — ACETAMINOPHEN 500 MG PO TABS: 1000.0000 mg | ORAL_TABLET | Freq: Four times a day (QID) | ORAL | Status: AC | PRN

## 2023-08-17 MED ORDER — OXYCODONE HCL 5 MG PO TABS: 5.0000 mg | ORAL_TABLET | ORAL | 0 refills | Status: DC | PRN

## 2023-08-17 MED ORDER — METHOCARBAMOL 500 MG PO TABS: 500.0000 mg | ORAL_TABLET | Freq: Three times a day (TID) | ORAL | 0 refills | Status: DC | PRN

## 2023-08-17 NOTE — Consult Note (Signed)
WOC Nurse ostomy follow up Requested to teach further about the pouch system, Pt was alert and ready to have more instructions.  Stoma type/location: LLQ colostomy pink moist budded. Productive. Stomal assessment/size: 1 1/2 in. Pink and moisture. Peristomal assessment: Skin intact Treatment options for stomal/peristomal skin:  Output 50 ml Ostomy pouching: 1pc softconvex. Education provided: reviewed how to take it off without skin damage. Change the system each 3 or 5 days. Clean with water and soap. Measure and cut the barrier respecting size and shape of the ostomy, if the size change, measure again before cutting the new pouch.  Teach how to cut the barrier, Pt does not want to use the ring.  Patient was participated on the changing. He close and up the pouch with any problem.   Empty pouch when 1/3  to  full of stool/flatus Clean bottom 2-inches of fecal pouch prior to resealing Assist patient in emptying pouch Change pouch twice weekly and PRN.  Write date of pouch application on pouch. Have spare pouch at bedside at all times Presented the Ostomy Teaching Booklet (provided by [facility] nurse) for teaching/reinforcing patient and caregiver education regarding ostomy. Use Supplies:  Pouch One piece 2 1/8 in. LAWSON # [id number] (in supply room)   The patient will probably discharge tomorrow. If is not, the ostomy team will change the pouch on Friday.   Enrolled patient in [place] Secure Start Discharge program: Yes     Thank-you,  Denyse Amass BSN, RN, [facility], WOC  (Pager: [phone])
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
## 2023-08-17 NOTE — Discharge Instructions (Signed)
WOUND CARE: - midline dressing to be changed daily - supplies: sterile saline, gauze, scissors, tape  - remove dressing and all packing carefully, moistening with sterile saline as needed to avoid packing/internal dressing sticking to the wound. - clean edges of skin around the wound with water/gauze, making sure there is no tape debris or leakage left on skin that could cause skin irritation or breakdown. - dampen and clean gauze with sterile saline and pack wound from wound base to skin level, making sure to take note of any possible areas of wound tracking, tunneling and packing appropriately. Wound can be packed loosely. Trim gauze to size if a whole gauze is not required. - cover wound with a dry gauze and secure with tape.  - write the date/time on the dry dressing/tape to better track when the last dressing change occurred. - change dressing as needed if leakage occurs, wound gets contaminated, or patient requests to shower. - patient may shower daily with wound open (i.e. remove all packing) and following the shower the wound should be dried and a clean dressing placed.    [facility]      [facility], [place] [phone]  OPEN ABDOMINAL SURGERY: POST OP INSTRUCTIONS  Always review your discharge instruction sheet given to you by the facility where your surgery was performed.  IF YOU HAVE DISABILITY OR FAMILY LEAVE FORMS, YOU MUST BRING THEM TO THE OFFICE FOR PROCESSING.  PLEASE DO NOT GIVE THEM TO YOUR DOCTOR.  A prescription for pain medication may be given to you upon discharge.  Take your pain medication as prescribed, if needed.  If narcotic pain medicine is not needed, then you may take acetaminophen (Tylenol) or ibuprofen (Advil) as needed. Take your usually prescribed medications unless otherwise directed. If you need a refill on your pain medication, please contact your pharmacy. They will contact our office to request authorization.  Prescriptions will not be filled after  5pm or on week-ends. You should follow a light diet the first few days after arrival home, such as soup and crackers, pudding, etc.unless your doctor has advised otherwise. A high-fiber, low fat diet can be resumed as tolerated.   Be sure to include lots of fluids daily. Most patients will experience some swelling and bruising on the chest and neck area.  Ice packs will help.  Swelling and bruising can take several days to resolve Most patients will experience some swelling and bruising in the area of the incision. Ice pack will help. Swelling and bruising can take several days to resolve..  It is common to experience some constipation if taking pain medication after surgery.  Increasing fluid intake and taking a stool softener will usually help or prevent this problem from occurring.  A mild laxative (Milk of Magnesia or Miralax) should be taken according to package directions if there are no bowel movements after 48 hours.  You may have steri-strips (small skin tapes) in place directly over the incision.  These strips should be left on the skin for 7-10 days.  If your surgeon used skin glue on the incision, you may shower in 24 hours.  The glue will flake off over the next 2-3 weeks.  Any sutures or staples will be removed at the office during your follow-up visit. You may find that a light gauze bandage over your incision may keep your staples from being rubbed or pulled. You may shower and replace the bandage daily. ACTIVITIES:  You may resume regular (light) daily activities beginning the  next day--such as daily self-care, walking, climbing stairs--gradually increasing activities as tolerated.  You may have sexual intercourse when it is comfortable.  Refrain from any heavy lifting or straining until approved by your doctor. You may drive when you no longer are taking prescription pain medication, you can comfortably wear a seatbelt, and you can safely maneuver your car and apply brakes Return to Work:  ___________________________________ Trevor [name] should see your doctor in the office for a follow-up appointment approximately two weeks after your surgery.  Make sure that you call for this appointment within a day or two after you arrive home to insure a convenient appointment time. OTHER INSTRUCTIONS:  _____________________________________________________________ _____________________________________________________________  WHEN TO CALL YOUR DOCTOR: Fever over 101.0 Inability to urinate Nausea and/or vomiting Extreme swelling or bruising Continued bleeding from incision. Increased pain, redness, or drainage from the incision. Difficulty swallowing or breathing Muscle cramping or spasms. Numbness or tingling in hands or feet or around lips.  The clinic staff is available to answer your questions during regular business hours.  Please don't hesitate to call and ask to speak to one of the nurses if you have concerns.  For further questions, please visit [email or web address]     Managing Your Pain After Surgery Without Opioids    Thank you for participating in our program to help patients manage their pain after surgery without opioids. This is part of our effort to provide you with the best care possible, without exposing you or your family to the risk that opioids pose.  What pain can I expect after surgery? You can expect to have some pain after surgery. This is normal. The pain is typically worse the day after surgery, and quickly begins to get better. Many studies have found that many patients are able to manage their pain after surgery with Over-the-Counter (OTC) medications such as Tylenol and Motrin. If you have a condition that does not allow you to take Tylenol or Motrin, notify your surgical team.  How will I manage my pain? The best strategy for controlling your pain after surgery is around the clock pain control with Tylenol (acetaminophen) and Motrin (ibuprofen or  Advil). Alternating these medications with each other allows you to maximize your pain control. In addition to Tylenol and Motrin, you can use heating pads or ice packs on your incisions to help reduce your pain.  How will I alternate your regular strength over-the-counter pain medication? You will take a dose of pain medication every three hours. Start by taking 650 mg of Tylenol (2 pills of 325 mg) 3 hours later take 600 mg of Motrin (3 pills of 200 mg) 3 hours after taking the Motrin take 650 mg of Tylenol 3 hours after that take 600 mg of Motrin.   - 1 -  See example - if your first dose of Tylenol is at 12:00 PM   12:00 PM Tylenol 650 mg (2 pills of 325 mg)  3:00 PM Motrin 600 mg (3 pills of 200 mg)  6:00 PM Tylenol 650 mg (2 pills of 325 mg)  9:00 PM Motrin 600 mg (3 pills of 200 mg)  Continue alternating every 3 hours   We recommend that you follow this schedule around-the-clock for at least 3 days after surgery, or until you feel that it is no longer needed. Use the table on the last page of this handout to keep track of the medications you are taking. Important: Do not take more than 3000mg  of Tylenol or 3200mg  of  Motrin in a 24-hour period. Do not take ibuprofen/Motrin if you have a history of bleeding stomach ulcers, severe kidney disease, &/or actively taking a blood thinner  What if I still have pain? If you have pain that is not controlled with the over-the-counter pain medications (Tylenol and Motrin or Advil) you might have what we call "breakthrough" pain. You will receive a prescription for a small amount of an opioid pain medication such as Oxycodone, Tramadol, or Tylenol with Codeine. Use these opioid pills in the first 24 hours after surgery if you have breakthrough pain. Do not take more than 1 pill every 4-6 hours.  If you still have uncontrolled pain after using all opioid pills, don't hesitate to call our staff using the number provided. We will help make sure  you are managing your pain in the best way possible, and if necessary, we can provide a prescription for additional pain medication.   Day 1    Time  Name of Medication Number of pills taken  Amount of Acetaminophen  Pain Level   Comments  AM PM       AM PM       AM PM       AM PM       AM PM       AM PM       AM PM       AM PM       Total Daily amount of Acetaminophen Do not take more than  3,000 mg per day      Day 2    Time  Name of Medication Number of pills taken  Amount of Acetaminophen  Pain Level   Comments  AM PM       AM PM       AM PM       AM PM       AM PM       AM PM       AM PM       AM PM       Total Daily amount of Acetaminophen Do not take more than  3,000 mg per day      Day 3    Time  Name of Medication Number of pills taken  Amount of Acetaminophen  Pain Level   Comments  AM PM       AM PM       AM PM       AM PM         AM PM       AM PM       AM PM       AM PM       Total Daily amount of Acetaminophen Do not take more than  3,000 mg per day      Day 4    Time  Name of Medication Number of pills taken  Amount of Acetaminophen  Pain Level   Comments  AM PM       AM PM       AM PM       AM PM       AM PM       AM PM       AM PM       AM PM       Total Daily amount of Acetaminophen Do not take more than  3,000 mg per day      Day 5  Time  Name of Medication Number of pills taken  Amount of Acetaminophen  Pain Level   Comments  AM PM       AM PM       AM PM       AM PM       AM PM       AM PM       AM PM       AM PM       Total Daily amount of Acetaminophen Do not take more than  3,000 mg per day      Day 6    Time  Name of Medication Number of pills taken  Amount of Acetaminophen  Pain Level  Comments  AM PM       AM PM       AM PM       AM PM       AM PM       AM PM       AM PM       AM PM       Total Daily amount of Acetaminophen Do not take more than  3,000 mg per day       Day 7    Time  Name of Medication Number of pills taken  Amount of Acetaminophen  Pain Level   Comments  AM PM       AM PM       AM PM       AM PM       AM PM       AM PM       AM PM       AM PM       Total Daily amount of Acetaminophen Do not take more than  3,000 mg per day        For additional information about how and where to safely dispose of unused opioid medications - [email or web address]  Disclaimer: This document contains information and/or instructional materials adapted from [facility] for the typical patient with your condition. It does not replace medical advice from your health care provider because your experience may differ from that of the typical patient. Talk to your health care provider if you have any questions about this document, your condition or your treatment plan. Adapted from [facility]

## 2023-08-17 NOTE — Plan of Care (Signed)
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
## 2023-09-25 ENCOUNTER — Ambulatory Visit (HOSPITAL_COMMUNITY): Admission: EM | Admit: 2023-09-25 | Discharge: 2023-09-25 | Discharge status: Against Medical Advice | Payer: MEDICAID
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
## 2023-09-29 ENCOUNTER — Other Ambulatory Visit: Payer: Self-pay

## 2023-09-29 ENCOUNTER — Emergency Department (HOSPITAL_COMMUNITY)
Admission: EM | Admit: 2023-09-29 | Discharge: 2023-09-29 | Disposition: A | Discharge status: Home / Self Care | Payer: MEDICAID | Attending: Emergency Medicine | Admitting: Emergency Medicine

## 2023-09-29 ENCOUNTER — Encounter (HOSPITAL_COMMUNITY): Payer: Self-pay

## 2023-09-29 DIAGNOSIS — Z933 Colostomy status: Secondary | ICD-10-CM | POA: Insufficient documentation

## 2023-09-29 DIAGNOSIS — Z433 Encounter for attention to colostomy: Secondary | ICD-10-CM

## 2023-09-29 NOTE — ED Triage Notes (Signed)
Pt states he needs colostomy bags. Pt states he has an appointment tomorrow, but does not have enough supplies until then. Pt denies any other complaints at this time.

## 2023-09-29 NOTE — Discharge Instructions (Signed)
Please follow-up with your primary care provider regards recent and ER visit.  Today we changed out your colostomy bag we will need to follow-up with your primary care provider.  If symptoms change or worsen please return to ER.
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
## 2023-10-09 ENCOUNTER — Inpatient Hospital Stay (HOSPITAL_COMMUNITY): Admission: RE | Admit: 2023-10-09 | Payer: Self-pay | Source: Ambulatory Visit | Admitting: Nurse Practitioner

## 2023-10-12 ENCOUNTER — Other Ambulatory Visit: Payer: Self-pay

## 2023-10-12 ENCOUNTER — Emergency Department (HOSPITAL_COMMUNITY)
Admission: EM | Admit: 2023-10-12 | Discharge: 2023-10-12 | Disposition: A | Discharge status: Home / Self Care | Payer: MEDICAID | Attending: Emergency Medicine | Admitting: Emergency Medicine

## 2023-10-12 ENCOUNTER — Encounter (HOSPITAL_COMMUNITY): Payer: Self-pay

## 2023-10-12 DIAGNOSIS — R1084 Generalized abdominal pain: Secondary | ICD-10-CM | POA: Insufficient documentation

## 2023-10-12 LAB — COMPREHENSIVE METABOLIC PANEL
ALT: 13 U/L (ref 0–44)
AST: 24 U/L (ref 15–41)
Albumin: 4.6 g/dL (ref 3.5–5.0)
Alkaline Phosphatase: 61 U/L (ref 38–126)
Anion gap: 10 (ref 5–15)
BUN: 8 mg/dL (ref 6–20)
CO2: 26 mmol/L (ref 22–32)
Calcium: 9.7 mg/dL (ref 8.9–10.3)
Chloride: 102 mmol/L (ref 98–111)
Creatinine, Ser: 0.73 mg/dL (ref 0.61–1.24)
GFR, Estimated: >60 mL/min (ref >60)
Glucose, Bld: 90 mg/dL (ref 70–99)
Potassium: 4.2 mmol/L (ref 3.5–5.1)
Sodium: 138 mmol/L (ref 135–145)
Total Bilirubin: 0.7 mg/dL (ref 0.0–1.2)
Total Protein: 8.4 g/dL — ABNORMAL HIGH (ref 6.5–8.1)

## 2023-10-12 LAB — CBC WITH DIFFERENTIAL/PLATELET
Abs Immature Granulocytes: 0.01 10*3/uL (ref 0.00–0.07)
Basophils Absolute: 0 10*3/uL (ref 0.0–0.1)
Basophils Relative: 1 %
Eosinophils Absolute: 0.2 10*3/uL (ref 0.0–0.5)
Eosinophils Relative: 3 %
HCT: 45.7 % (ref 39.0–52.0)
Hemoglobin: 15.2 g/dL (ref 13.0–17.0)
Immature Granulocytes: 0 %
Lymphocytes Relative: 22 %
Lymphs Abs: 1.4 10*3/uL (ref 0.7–4.0)
MCH: 29.3 pg (ref 26.0–34.0)
MCHC: 33.3 g/dL (ref 30.0–36.0)
MCV: 88.2 fL (ref 80.0–100.0)
Monocytes Absolute: 0.4 10*3/uL (ref 0.1–1.0)
Monocytes Relative: 6 %
Neutro Abs: 4.5 10*3/uL (ref 1.7–7.7)
Neutrophils Relative %: 68 %
Platelets: 429 10*3/uL — ABNORMAL HIGH (ref 150–400)
RBC: 5.18 MIL/uL (ref 4.22–5.81)
RDW: 11.7 % (ref 11.5–15.5)
WBC: 6.5 10*3/uL (ref 4.0–10.5)
nRBC: 0 % (ref 0.0–0.2)

## 2023-10-12 LAB — LIPASE, BLOOD: Lipase: 23 U/L (ref 11–51)

## 2023-10-12 MED ORDER — ONDANSETRON HCL 4 MG/2ML IJ SOLN
4.0000 mg | Freq: Once | INTRAMUSCULAR | Status: AC
  Administered 2023-10-12: 4 mg via INTRAVENOUS
  Filled 2023-10-12: qty 2

## 2023-10-12 MED ORDER — OXYCODONE-ACETAMINOPHEN 5-325 MG PO TABS: 1.0000 | ORAL_TABLET | Freq: Four times a day (QID) | ORAL | 0 refills | Status: DC | PRN

## 2023-10-12 MED ORDER — ONDANSETRON HCL 4 MG PO TABS: 4.0000 mg | ORAL_TABLET | Freq: Three times a day (TID) | ORAL | 0 refills | Status: AC | PRN

## 2023-10-12 MED ORDER — FENTANYL CITRATE PF 50 MCG/ML IJ SOSY
50.0000 ug | PREFILLED_SYRINGE | Freq: Once | INTRAMUSCULAR | Status: AC
  Administered 2023-10-12: 50 ug via INTRAVENOUS
  Filled 2023-10-12: qty 1
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
## 2023-10-12 NOTE — ED Notes (Signed)
Pt has demonstrated ability to keep fluids down for last hour. States he is feeling less nauseous presently.

## 2023-10-13 ENCOUNTER — Ambulatory Visit (HOSPITAL_COMMUNITY)
Admission: RE | Admit: 2023-10-13 | Discharge: 2023-10-13 | Disposition: A | Discharge status: Home / Self Care | Payer: MEDICAID | Source: Ambulatory Visit | Attending: *Deleted | Admitting: *Deleted

## 2023-10-13 DIAGNOSIS — X58XXXA Exposure to other specified factors, initial encounter: Secondary | ICD-10-CM | POA: Diagnosis not present

## 2023-10-13 DIAGNOSIS — L24B3 Irritant contact dermatitis related to fecal or urinary stoma or fistula: Secondary | ICD-10-CM | POA: Diagnosis not present

## 2023-10-13 DIAGNOSIS — T8189XA Other complications of procedures, not elsewhere classified, initial encounter: Secondary | ICD-10-CM | POA: Diagnosis not present

## 2023-10-13 DIAGNOSIS — Z433 Encounter for attention to colostomy: Secondary | ICD-10-CM | POA: Diagnosis present

## 2023-10-13 NOTE — Discharge Instructions (Addendum)
Switching to 2 piece convex Use strip silver dressing to wound Dry dressing on top  change every other day.   Please email me with questions and with Medicaid information so I can set up with [facility] for supplies  [email or web address] 
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
## 2023-10-19 ENCOUNTER — Other Ambulatory Visit (HOSPITAL_COMMUNITY): Payer: Self-pay | Admitting: Nurse Practitioner

## 2023-10-19 DIAGNOSIS — Z433 Encounter for attention to colostomy: Secondary | ICD-10-CM | POA: Insufficient documentation

## 2023-10-19 DIAGNOSIS — L24B3 Irritant contact dermatitis related to fecal or urinary stoma or fistula: Secondary | ICD-10-CM | POA: Insufficient documentation

## 2023-10-19 DIAGNOSIS — T8189XA Other complications of procedures, not elsewhere classified, initial encounter: Secondary | ICD-10-CM | POA: Insufficient documentation

## 2023-11-29 ENCOUNTER — Emergency Department (HOSPITAL_COMMUNITY)
Admission: EM | Admit: 2023-11-29 | Discharge: 2023-11-29 | Disposition: A | Discharge status: Home / Self Care | Payer: MEDICAID | Attending: Emergency Medicine | Admitting: Emergency Medicine

## 2023-11-29 ENCOUNTER — Other Ambulatory Visit: Payer: Self-pay

## 2023-11-29 ENCOUNTER — Encounter (HOSPITAL_COMMUNITY): Payer: Self-pay | Admitting: *Deleted

## 2023-11-29 DIAGNOSIS — Z433 Encounter for attention to colostomy: Secondary | ICD-10-CM | POA: Diagnosis present
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
## 2023-11-29 NOTE — ED Notes (Signed)
 Patient discharged by this RN. Patient verbalizes understanding of instructions with no additional questions for this RN.

## 2023-11-29 NOTE — ED Triage Notes (Signed)
 The pt reports that he needs colostomy supplies that he always gets them here

## 2023-11-29 NOTE — Discharge Instructions (Signed)
 Follow up outpatient, Return for new or worsening symptoms

## 2024-01-06 ENCOUNTER — Ambulatory Visit: Payer: Self-pay | Admitting: General Surgery

## 2024-01-06 NOTE — Pre-Procedure Instructions (Signed)
 Surgical Instructions   Your procedure is scheduled on Jan 18, 2024. Report to [facility] Main Entrance "A" at 7:30 A.M., then check in with the Admitting office. Any questions or running late day of surgery: call [phone]  Questions prior to your surgery date: call [phone], Monday-Friday, 8am-4pm. If you experience any cold or flu symptoms such as cough, fever, chills, shortness of breath, etc. between now and your scheduled surgery, please notify us  at the above number.     Remember:  Do not eat after midnight the night before your surgery   You may drink clear liquids until 6:30 AM the morning of your surgery.   Clear liquids allowed are: Water, Non-Citrus Juices (without pulp), Carbonated Beverages, Clear Tea (no milk, honey, etc.), Black Coffee Only (NO MILK, CREAM OR POWDERED CREAMER of any kind), and Gatorade.  Patient Instructions  The night before surgery:  No food after midnight. ONLY clear liquids after midnight. Drink TWO (2) Pre-Surgery Ensure's the night prior to surgery.  The day of surgery (if you do NOT have diabetes):  Drink ONE (1) Pre-Surgery Clear Ensure by 6:30 AM the morning of surgery. Drink in one sitting. Do not sip.  This drink was given to you during your hospital  pre-op appointment visit.  Nothing else to drink after completing the  Pre-Surgery Clear Ensure.   Please follow the bowel prep instructions that you received from Dr. Velna Ghee office. If you have any questions about these instructions, you will need to contact his office.    Take these medicines the morning of surgery with A SIP OF WATER: NONE   May take these medicines IF NEEDED: acetaminophen  (TYLENOL )  methocarbamol  (ROBAXIN )  ondansetron  (ZOFRAN )    One week prior to surgery, STOP taking any Aspirin (unless otherwise instructed by your surgeon) Aleve, Naproxen, Ibuprofen , Motrin , Advil , Goody's, BC's, all herbal medications, fish oil, and non-prescription vitamins.                      Do NOT Smoke (Tobacco/Vaping) for 24 hours prior to your procedure.  If you use a CPAP at night, you may bring your mask/headgear for your overnight stay.   You will be asked to remove any contacts, glasses, piercing's, hearing aid's, dentures/partials prior to surgery. Please bring cases for these items if needed.    Patients discharged the day of surgery will not be allowed to drive home, and someone needs to stay with them for 24 hours.  SURGICAL WAITING ROOM VISITATION Patients may have no more than 2 support people in the waiting area - these visitors may rotate.   Pre-op nurse will coordinate an appropriate time for 1 ADULT support person, who may not rotate, to accompany patient in pre-op.  Children under the age of 40 must have an adult with them who is not the patient and must remain in the main waiting area with an adult.  If the patient needs to stay at the hospital during part of their recovery, the visitor guidelines for inpatient rooms apply.  Please refer to the [facility] website for the visitor guidelines for any additional information.   If you received a COVID test during your pre-op visit  it is requested that you wear a mask when out in public, stay away from anyone that may not be feeling well and notify your surgeon if you develop symptoms. If you have been in contact with anyone that has tested positive in the last 10 days please  notify you surgeon.      Pre-operative CHG Bathing Instructions   You can play a key role in reducing the risk of infection after surgery. Your skin needs to be as free of germs as possible. You can reduce the number of germs on your skin by washing with CHG (chlorhexidine  gluconate) soap before surgery. CHG is an antiseptic soap that kills germs and continues to kill germs even after washing.   DO NOT use if you have an allergy to chlorhexidine /CHG or antibacterial soaps. If your skin becomes reddened or irritated,  stop using the CHG and notify one of our RNs at [phone].              TAKE A SHOWER THE NIGHT BEFORE SURGERY AND THE DAY OF SURGERY    Please keep in mind the following:  DO NOT shave, including legs and underarms, 48 hours prior to surgery.   You may shave your face before/day of surgery.  Place clean sheets on your bed the night before surgery Use a clean washcloth (not used since being washed) for each shower. DO NOT sleep with pet's night before surgery.  CHG Shower Instructions:  Wash your face and private area with normal soap. If you choose to wash your hair, wash first with your normal shampoo.  After you use shampoo/soap, rinse your hair and body thoroughly to remove shampoo/soap residue.  Turn the water OFF and apply half the bottle of CHG soap to a CLEAN washcloth.  Apply CHG soap ONLY FROM YOUR NECK DOWN TO YOUR TOES (washing for 3-5 minutes)  DO NOT use CHG soap on face, private areas, open wounds, or sores.  Pay special attention to the area where your surgery is being performed.  If you are having back surgery, having someone wash your back for you may be helpful. Wait 2 minutes after CHG soap is applied, then you may rinse off the CHG soap.  Pat dry with a clean towel  Put on clean pajamas    Additional instructions for the day of surgery: DO NOT APPLY any lotions, deodorants, cologne, or perfumes.   Do not wear jewelry or makeup Do not wear nail polish, gel polish, artificial nails, or any other type of covering on natural nails (fingers and toes) Do not bring valuables to the hospital. [facility] is not responsible for valuables/personal belongings. Put on clean/comfortable clothes.  Please brush your teeth.  Ask your nurse before applying any prescription medications to the skin.

## 2024-01-07 ENCOUNTER — Encounter (HOSPITAL_COMMUNITY)
Admission: RE | Admit: 2024-01-07 | Discharge: 2024-01-07 | Disposition: A | Discharge status: Home / Self Care | Payer: MEDICAID | Source: Ambulatory Visit | Attending: General Surgery | Admitting: General Surgery

## 2024-01-07 ENCOUNTER — Other Ambulatory Visit: Payer: Self-pay

## 2024-01-07 ENCOUNTER — Encounter (HOSPITAL_COMMUNITY): Payer: Self-pay

## 2024-01-07 VITALS — BP 106/75 | HR 112 | Temp 98.2°F | Resp 17 | Ht 63.0 in | Wt 90.0 lb

## 2024-01-07 DIAGNOSIS — Z01812 Encounter for preprocedural laboratory examination: Secondary | ICD-10-CM | POA: Diagnosis present

## 2024-01-07 DIAGNOSIS — Z01818 Encounter for other preprocedural examination: Secondary | ICD-10-CM

## 2024-01-07 LAB — CBC
HCT: 39.2 % (ref 39.0–52.0)
Hemoglobin: 13 g/dL (ref 13.0–17.0)
MCH: 30.5 pg (ref 26.0–34.0)
MCHC: 33.2 g/dL (ref 30.0–36.0)
MCV: 92 fL (ref 80.0–100.0)
Platelets: 450 10*3/uL — ABNORMAL HIGH (ref 150–400)
RBC: 4.26 MIL/uL (ref 4.22–5.81)
RDW: 12.2 % (ref 11.5–15.5)
WBC: 6.5 10*3/uL (ref 4.0–10.5)
nRBC: 0 % (ref 0.0–0.2)

## 2024-01-07 NOTE — Progress Notes (Signed)
 PCP - No PCP Cardiologist - Denies  PPM/ICD - Denies Device Orders - n/a Rep Notified - n/a  Chest x-ray - 08/11/2023 EKG - Denies Stress Test - Denies ECHO - Denies Cardiac Cath - Denies  Sleep Study - Denies CPAP - n/a  No DM  Last dose of GLP1 agonist- n/a GLP1 instructions: n/a  Blood Thinner Instructions: n/a Aspirin Instructions: n/a  ERAS Protcol - Clear liquids until 0630 morning of surgery PRE-SURGERY Ensure or G2- Ensure x3 given to pt with instructions  COVID TEST- n/a   Anesthesia review: No. Pt received bowel prep instructions at Dr. Velna Ghee office prior to PAT appointment.   Patient denies shortness of breath, fever, cough and chest pain at PAT appointment. Pt denies any respiratory illness/infection in the last two months.   All instructions explained to the patient, with a verbal understanding of the material. Patient agrees to go over the instructions while at home for a better understanding. Patient also instructed to self quarantine after being tested for COVID-19. The opportunity to ask questions was provided.

## 2024-01-17 NOTE — Anesthesia Preprocedure Evaluation (Signed)
 Anesthesia Evaluation  Patient identified by MRN, date of birth, ID band Patient awake    Reviewed: Allergy & Precautions, NPO status , Patient's Chart, lab work & pertinent test results  Airway Mallampati: I  TM Distance: >3 FB Neck ROM: Full    Dental no notable dental hx. (+) Teeth Intact, Dental Advisory Given   Pulmonary neg pulmonary ROS, Patient abstained from smoking.   Pulmonary exam normal breath sounds clear to auscultation       Cardiovascular negative cardio ROS Normal cardiovascular exam Rhythm:Regular Rate:Normal     Neuro/Psych negative neurological ROS     GI/Hepatic negative GI ROS, Neg liver ROS,,,  Endo/Other  negative endocrine ROS    Renal/GU      Musculoskeletal negative musculoskeletal ROS (+)    Abdominal   Peds  Hematology   Anesthesia Other Findings   Reproductive/Obstetrics                             Anesthesia Physical Anesthesia Plan  ASA: 2  Anesthesia Plan: General   Post-op Pain Management: Tylenol  PO (pre-op)* and Precedex    Induction: Intravenous  PONV Risk Score and Plan: Treatment may vary due to age or medical condition, Midazolam  and Ondansetron   Airway Management Planned: Oral ETT  Additional Equipment: None  Intra-op Plan:   Post-operative Plan: Extubation in OR  Informed Consent: I have reviewed the patients History and Physical, chart, labs and discussed the procedure including the risks, benefits and alternatives for the proposed anesthesia with the patient or authorized representative who has indicated his/her understanding and acceptance.     Dental advisory given  Plan Discussed with: CRNA and Surgeon  Anesthesia Plan Comments:        Anesthesia Quick Evaluation

## 2024-01-18 ENCOUNTER — Inpatient Hospital Stay (HOSPITAL_COMMUNITY)
Admission: RE | Admit: 2024-01-18 | Discharge: 2024-01-22 | DRG: 331 | Disposition: A | Discharge status: Home / Self Care | Payer: MEDICAID | Attending: General Surgery | Admitting: General Surgery

## 2024-01-18 ENCOUNTER — Encounter (HOSPITAL_COMMUNITY): Payer: Self-pay | Admitting: General Surgery

## 2024-01-18 ENCOUNTER — Encounter (HOSPITAL_COMMUNITY)
Admission: RE | Disposition: A | Discharge status: Home / Self Care | Payer: Self-pay | Source: Home / Self Care | Attending: General Surgery

## 2024-01-18 ENCOUNTER — Other Ambulatory Visit: Payer: Self-pay

## 2024-01-18 ENCOUNTER — Ambulatory Visit (HOSPITAL_COMMUNITY): Payer: MEDICAID | Admitting: Anesthesiology

## 2024-01-18 DIAGNOSIS — K660 Peritoneal adhesions (postprocedural) (postinfection): Secondary | ICD-10-CM | POA: Diagnosis present

## 2024-01-18 DIAGNOSIS — Z433 Encounter for attention to colostomy: Secondary | ICD-10-CM | POA: Diagnosis present

## 2024-01-18 DIAGNOSIS — T148XXD Other injury of unspecified body region, subsequent encounter: Secondary | ICD-10-CM | POA: Diagnosis not present

## 2024-01-18 DIAGNOSIS — K219 Gastro-esophageal reflux disease without esophagitis: Secondary | ICD-10-CM | POA: Diagnosis not present

## 2024-01-18 DIAGNOSIS — W3400XD Accidental discharge from unspecified firearms or gun, subsequent encounter: Secondary | ICD-10-CM | POA: Diagnosis not present

## 2024-01-18 DIAGNOSIS — Z9889 Other specified postprocedural states: Principal | ICD-10-CM

## 2024-01-18 HISTORY — PX: COLOSTOMY TAKEDOWN: SHX5783

## 2024-01-18 SURGERY — CLOSURE, COLOSTOMY
Anesthesia: General | Site: Abdomen

## 2024-01-18 MED ORDER — SODIUM CHLORIDE (PF) 0.9 % IJ SOLN
INTRAMUSCULAR | Status: AC
  Filled 2024-01-18: qty 10

## 2024-01-18 MED ORDER — PHENYLEPHRINE 80 MCG/ML (10ML) SYRINGE FOR IV PUSH (FOR BLOOD PRESSURE SUPPORT)
PREFILLED_SYRINGE | INTRAVENOUS | Status: AC
  Filled 2024-01-18: qty 10

## 2024-01-18 MED ORDER — OXYCODONE HCL 5 MG PO TABS
5.0000 mg | ORAL_TABLET | ORAL | Status: DC | PRN
  Administered 2024-01-18 – 2024-01-19 (×2): 10 mg via ORAL
  Administered 2024-01-20: 5 mg via ORAL
  Administered 2024-01-20 – 2024-01-22 (×5): 10 mg via ORAL
  Filled 2024-01-18 (×8): qty 2

## 2024-01-18 MED ORDER — ONDANSETRON HCL 4 MG/2ML IJ SOLN
4.0000 mg | Freq: Four times a day (QID) | INTRAMUSCULAR | Status: DC | PRN
  Administered 2024-01-20: 4 mg via INTRAVENOUS
  Filled 2024-01-18: qty 2

## 2024-01-18 MED ORDER — LIDOCAINE 2% (20 MG/ML) 5 ML SYRINGE
INTRAMUSCULAR | Status: AC
  Filled 2024-01-18: qty 5

## 2024-01-18 MED ORDER — HYDROMORPHONE HCL 1 MG/ML IJ SOLN
INTRAMUSCULAR | Status: AC
  Filled 2024-01-18: qty 1

## 2024-01-18 MED ORDER — KCL IN DEXTROSE-NACL 20-5-0.45 MEQ/L-%-% IV SOLN
INTRAVENOUS | Status: AC
  Filled 2024-01-18 (×3): qty 1000

## 2024-01-18 MED ORDER — MIDAZOLAM HCL 2 MG/2ML IJ SOLN
INTRAMUSCULAR | Status: AC
  Filled 2024-01-18: qty 2

## 2024-01-18 MED ORDER — ONDANSETRON HCL 4 MG/2ML IJ SOLN
INTRAMUSCULAR | Status: DC | PRN
  Administered 2024-01-18: 4 mg via INTRAVENOUS

## 2024-01-18 MED ORDER — METHOCARBAMOL 750 MG PO TABS
750.0000 mg | ORAL_TABLET | Freq: Three times a day (TID) | ORAL | Status: DC
  Administered 2024-01-18 – 2024-01-21 (×11): 750 mg via ORAL
  Filled 2024-01-18 (×11): qty 1

## 2024-01-18 MED ORDER — FENTANYL CITRATE (PF) 100 MCG/2ML IJ SOLN
INTRAMUSCULAR | Status: AC
  Filled 2024-01-18: qty 2

## 2024-01-18 MED ORDER — CEFOTETAN DISODIUM 2 G IJ SOLR
2.0000 g | INTRAMUSCULAR | Status: AC
  Administered 2024-01-18: 2 g via INTRAVENOUS
  Filled 2024-01-18: qty 2

## 2024-01-18 MED ORDER — ROCURONIUM BROMIDE 10 MG/ML (PF) SYRINGE
PREFILLED_SYRINGE | INTRAVENOUS | Status: DC | PRN
  Administered 2024-01-18: 30 mg via INTRAVENOUS

## 2024-01-18 MED ORDER — MIDAZOLAM HCL 2 MG/2ML IJ SOLN
INTRAMUSCULAR | Status: DC | PRN
  Administered 2024-01-18: 2 mg via INTRAVENOUS

## 2024-01-18 MED ORDER — HYDROMORPHONE HCL 1 MG/ML IJ SOLN
0.2500 mg | INTRAMUSCULAR | Status: DC | PRN
  Administered 2024-01-18 (×4): 0.5 mg via INTRAVENOUS

## 2024-01-18 MED ORDER — CARMEX CLASSIC LIP BALM EX OINT
TOPICAL_OINTMENT | CUTANEOUS | Status: DC | PRN
  Filled 2024-01-18: qty 10

## 2024-01-18 MED ORDER — ALBUMIN HUMAN 5 % IV SOLN
12.5000 g | Freq: Once | INTRAVENOUS | Status: AC
  Administered 2024-01-18: 12.5 g via INTRAVENOUS
  Filled 2024-01-18: qty 250

## 2024-01-18 MED ORDER — ENOXAPARIN SODIUM 40 MG/0.4ML IJ SOSY
40.0000 mg | PREFILLED_SYRINGE | INTRAMUSCULAR | Status: DC
  Administered 2024-01-19 – 2024-01-21 (×3): 40 mg via SUBCUTANEOUS
  Filled 2024-01-18 (×3): qty 0.4

## 2024-01-18 MED ORDER — ONDANSETRON 4 MG PO TBDP: 4.0000 mg | ORAL_TABLET | Freq: Four times a day (QID) | ORAL | Status: DC | PRN

## 2024-01-18 MED ORDER — 0.9 % SODIUM CHLORIDE (POUR BTL) OPTIME
TOPICAL | Status: DC | PRN
  Administered 2024-01-18: 1000 mL

## 2024-01-18 MED ORDER — OXYCODONE HCL 5 MG/5ML PO SOLN
5.0000 mg | Freq: Once | ORAL | Status: AC | PRN
  Administered 2024-01-18: 5 mg via ORAL

## 2024-01-18 MED ORDER — HYDRALAZINE HCL 20 MG/ML IJ SOLN: 10.0000 mg | INTRAMUSCULAR | Status: DC | PRN

## 2024-01-18 MED ORDER — EPHEDRINE 5 MG/ML INJ
INTRAVENOUS | Status: AC
  Filled 2024-01-18: qty 5

## 2024-01-18 MED ORDER — HYDROMORPHONE HCL 1 MG/ML IJ SOLN
0.2500 mg | INTRAMUSCULAR | Status: DC | PRN
  Administered 2024-01-18 (×2): 0.5 mg via INTRAVENOUS

## 2024-01-18 MED ORDER — FENTANYL CITRATE (PF) 250 MCG/5ML IJ SOLN
INTRAMUSCULAR | Status: DC | PRN
Start: 2024-01-18 — End: 2024-01-18
  Administered 2024-01-18 (×2): 50 ug via INTRAVENOUS

## 2024-01-18 MED ORDER — CHLORHEXIDINE GLUCONATE CLOTH 2 % EX PADS: 6.0000 | MEDICATED_PAD | Freq: Once | CUTANEOUS | Status: DC

## 2024-01-18 MED ORDER — OXYCODONE HCL 5 MG PO TABS: 5.0000 mg | ORAL_TABLET | Freq: Once | ORAL | Status: AC | PRN

## 2024-01-18 MED ORDER — HYDROMORPHONE HCL 1 MG/ML IJ SOLN
INTRAMUSCULAR | Status: AC
Start: 2024-01-18 — End: ?
  Filled 2024-01-18: qty 0.5

## 2024-01-18 MED ORDER — DIPHENHYDRAMINE HCL 25 MG PO CAPS: 25.0000 mg | ORAL_CAPSULE | Freq: Four times a day (QID) | ORAL | Status: DC | PRN

## 2024-01-18 MED ORDER — EPHEDRINE SULFATE-NACL 50-0.9 MG/10ML-% IV SOSY
PREFILLED_SYRINGE | INTRAVENOUS | Status: DC | PRN
  Administered 2024-01-18: 10 mg via INTRAVENOUS

## 2024-01-18 MED ORDER — ENSURE PRE-SURGERY PO LIQD: 296.0000 mL | Freq: Once | ORAL | Status: DC

## 2024-01-18 MED ORDER — ENSURE PRE-SURGERY PO LIQD: 592.0000 mL | Freq: Once | ORAL | Status: DC

## 2024-01-18 MED ORDER — LACTATED RINGERS IV SOLN: INTRAVENOUS | Status: DC

## 2024-01-18 MED ORDER — GABAPENTIN 300 MG PO CAPS
300.0000 mg | ORAL_CAPSULE | ORAL | Status: AC
  Administered 2024-01-18: 300 mg via ORAL
  Filled 2024-01-18: qty 1

## 2024-01-18 MED ORDER — ONDANSETRON HCL 4 MG/2ML IJ SOLN: 4.0000 mg | Freq: Once | INTRAMUSCULAR | Status: DC | PRN

## 2024-01-18 MED ORDER — OXYCODONE HCL 5 MG/5ML PO SOLN
ORAL | Status: AC
Start: 2024-01-18 — End: 2024-01-19
  Filled 2024-01-18: qty 5

## 2024-01-18 MED ORDER — PHENYLEPHRINE 80 MCG/ML (10ML) SYRINGE FOR IV PUSH (FOR BLOOD PRESSURE SUPPORT)
PREFILLED_SYRINGE | INTRAVENOUS | Status: DC | PRN
  Administered 2024-01-18 (×2): 80 ug via INTRAVENOUS

## 2024-01-18 MED ORDER — KETOROLAC TROMETHAMINE 30 MG/ML IJ SOLN: 15.0000 mg | Freq: Once | INTRAMUSCULAR | Status: DC | PRN

## 2024-01-18 MED ORDER — CELECOXIB 200 MG PO CAPS
400.0000 mg | ORAL_CAPSULE | ORAL | Status: AC
  Administered 2024-01-18: 400 mg via ORAL
  Filled 2024-01-18: qty 2

## 2024-01-18 MED ORDER — ONDANSETRON HCL 4 MG/2ML IJ SOLN
INTRAMUSCULAR | Status: AC
  Filled 2024-01-18: qty 2

## 2024-01-18 MED ORDER — CHLORHEXIDINE GLUCONATE 0.12 % MT SOLN
15.0000 mL | Freq: Once | OROMUCOSAL | Status: AC
  Administered 2024-01-18: 15 mL via OROMUCOSAL
  Filled 2024-01-18: qty 15

## 2024-01-18 MED ORDER — PROPOFOL 10 MG/ML IV BOLUS
INTRAVENOUS | Status: DC | PRN
  Administered 2024-01-18: 120 mg via INTRAVENOUS

## 2024-01-18 MED ORDER — ORAL CARE MOUTH RINSE: 15.0000 mL | Freq: Once | OROMUCOSAL | Status: AC

## 2024-01-18 MED ORDER — SUGAMMADEX SODIUM 200 MG/2ML IV SOLN
INTRAVENOUS | Status: DC | PRN
  Administered 2024-01-18: 90 mg via INTRAVENOUS

## 2024-01-18 MED ORDER — PROPOFOL 10 MG/ML IV BOLUS
INTRAVENOUS | Status: AC
  Filled 2024-01-18: qty 20

## 2024-01-18 MED ORDER — DEXAMETHASONE SODIUM PHOSPHATE 10 MG/ML IJ SOLN
INTRAMUSCULAR | Status: AC
  Filled 2024-01-18: qty 1

## 2024-01-18 MED ORDER — ACETAMINOPHEN 500 MG PO TABS
1000.0000 mg | ORAL_TABLET | ORAL | Status: AC
Start: 2024-01-18 — End: 2024-01-18
  Administered 2024-01-18: 1000 mg via ORAL
  Filled 2024-01-18: qty 2

## 2024-01-18 MED ORDER — ROCURONIUM BROMIDE 10 MG/ML (PF) SYRINGE
PREFILLED_SYRINGE | INTRAVENOUS | Status: AC
  Filled 2024-01-18: qty 10

## 2024-01-18 MED ORDER — ACETAMINOPHEN 325 MG PO TABS
650.0000 mg | ORAL_TABLET | Freq: Four times a day (QID) | ORAL | Status: DC
  Administered 2024-01-18 – 2024-01-22 (×13): 650 mg via ORAL
  Filled 2024-01-18 (×16): qty 2

## 2024-01-18 MED ORDER — DEXAMETHASONE SODIUM PHOSPHATE 10 MG/ML IJ SOLN
INTRAMUSCULAR | Status: DC | PRN
  Administered 2024-01-18: 4 mg via INTRAVENOUS

## 2024-01-18 MED ORDER — LIDOCAINE 2% (20 MG/ML) 5 ML SYRINGE
INTRAMUSCULAR | Status: DC | PRN
  Administered 2024-01-18: 25 mg via INTRAVENOUS

## 2024-01-18 MED ORDER — DIPHENHYDRAMINE HCL 50 MG/ML IJ SOLN: 25.0000 mg | Freq: Four times a day (QID) | INTRAMUSCULAR | Status: DC | PRN

## 2024-01-18 MED ORDER — HYDROMORPHONE HCL 1 MG/ML IJ SOLN
INTRAMUSCULAR | Status: AC
Start: 2024-01-18 — End: 2024-01-19
  Filled 2024-01-18: qty 1

## 2024-01-18 MED ORDER — HYDROMORPHONE HCL 1 MG/ML IJ SOLN
0.5000 mg | INTRAMUSCULAR | Status: DC | PRN
  Administered 2024-01-18 – 2024-01-19 (×3): 1 mg via INTRAVENOUS
  Filled 2024-01-18 (×4): qty 1

## 2024-01-18 SURGICAL SUPPLY — 42 items
BAG COUNTER SPONGE SURGICOUNT (BAG) ×1 IMPLANT
BLADE CLIPPER SURG (BLADE) IMPLANT
CANISTER SUCTION 3000ML PPV (SUCTIONS) ×1 IMPLANT
COVER SURGICAL LIGHT HANDLE (MISCELLANEOUS) ×2 IMPLANT
DRSG OPSITE POSTOP 4X10 (GAUZE/BANDAGES/DRESSINGS) IMPLANT
DRSG OPSITE POSTOP 4X6 (GAUZE/BANDAGES/DRESSINGS) IMPLANT
DRSG OPSITE POSTOP 4X8 (GAUZE/BANDAGES/DRESSINGS) IMPLANT
ELECT CAUTERY BLADE 6.4 (BLADE) ×1 IMPLANT
ELECTRODE REM PT RTRN 9FT ADLT (ELECTROSURGICAL) ×1 IMPLANT
GLOVE BIO SURGEON STRL SZ8 (GLOVE) ×2 IMPLANT
GLOVE BIOGEL PI IND STRL 8 (GLOVE) ×2 IMPLANT
GOWN STRL REUS W/ TWL LRG LVL3 (GOWN DISPOSABLE) ×4 IMPLANT
GOWN STRL REUS W/ TWL XL LVL3 (GOWN DISPOSABLE) ×2 IMPLANT
KIT SIGMOIDOSCOPE (SET/KITS/TRAYS/PACK) IMPLANT
KIT TURNOVER KIT B (KITS) ×1 IMPLANT
LIGASURE IMPACT 36 18CM CVD LR (INSTRUMENTS) IMPLANT
NS IRRIG 1000ML POUR BTL (IV SOLUTION) ×2 IMPLANT
PACK COLON (CUSTOM PROCEDURE TRAY) ×1 IMPLANT
PAD ARMBOARD POSITIONER FOAM (MISCELLANEOUS) ×1 IMPLANT
PENCIL SMOKE EVACUATOR (MISCELLANEOUS) ×1 IMPLANT
RELOAD PROXIMATE 75MM BLUE (ENDOMECHANICALS) ×1 IMPLANT
RELOAD STAPLE 75 3.8 BLU REG (ENDOMECHANICALS) IMPLANT
SPECIMEN JAR MEDIUM (MISCELLANEOUS) IMPLANT
SPONGE T-LAP 18X18 ~~LOC~~+RFID (SPONGE) IMPLANT
STAPLER PROXIMATE 75MM BLUE (STAPLE) IMPLANT
STAPLER SKIN PROX 35W (STAPLE) IMPLANT
STAPLER VISISTAT 35W (STAPLE) ×1 IMPLANT
SURGILUBE 2OZ TUBE FLIPTOP (MISCELLANEOUS) IMPLANT
SUT PDS AB 0 CT 36 (SUTURE) IMPLANT
SUT PDS AB 1 TP1 54 (SUTURE) IMPLANT
SUT PDS AB 1 TP1 96 (SUTURE) IMPLANT
SUT PDS II 0 TP-1 LOOPED 60 (SUTURE) IMPLANT
SUT PROLENE 2 0 CT2 30 (SUTURE) IMPLANT
SUT PROLENE 2 0 KS (SUTURE) IMPLANT
SUT SILK 2 0 SH CR/8 (SUTURE) ×1 IMPLANT
SUT SILK 2 0 TIES 10X30 (SUTURE) ×1 IMPLANT
SUT SILK 3 0 SH CR/8 (SUTURE) ×1 IMPLANT
SUT SILK 3 0 TIES 10X30 (SUTURE) ×1 IMPLANT
SUT VIC AB 3-0 SH 27X BRD (SUTURE) IMPLANT
TRAY FOLEY MTR SLVR 14FR STAT (SET/KITS/TRAYS/PACK) ×1 IMPLANT
TUBE CONNECTING 12X1/4 (SUCTIONS) ×2 IMPLANT
UNDERPAD 30X36 HEAVY ABSORB (UNDERPADS AND DIAPERS) IMPLANT
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
## 2024-01-18 NOTE — Plan of Care (Signed)

## 2024-01-18 NOTE — Plan of Care (Signed)
  Problem: Pain Managment: Goal: General experience of comfort will improve and/or be controlled Outcome: Progressing   Problem: Safety: Goal: Ability to remain free from injury will improve Outcome: Progressing

## 2024-01-18 NOTE — Transfer of Care (Signed)
 Immediate Anesthesia Transfer of Care Note  Patient: Trevor [name]  Procedure(s) Performed: CLOSURE, COLOSTOMY (Abdomen)  Patient Location: PACU  Anesthesia Type:General  Level of Consciousness: sedated, drowsy, and patient cooperative  Airway & Oxygen Therapy: Patient Spontanous Breathing  Post-op Assessment: Report given to RN, Post -op Vital signs reviewed and stable, and Patient moving all extremities X 4  Post vital signs: Reviewed and stable  Last Vitals:  Vitals Value Taken Time  BP 121/78 01/18/24 1146  Temp 97   Pulse 80 01/18/24 1150  Resp 25 01/18/24 1150  SpO2 100 % 01/18/24 1150  Vitals shown include unfiled device data.  Last Pain:  Vitals:   01/18/24 0812  TempSrc:   PainSc: 0-No pain         Complications: No notable events documented.

## 2024-01-18 NOTE — Anesthesia Postprocedure Evaluation (Signed)
 Anesthesia Post Note  Patient: Azra Hartsel  Procedure(s) Performed: CLOSURE, COLOSTOMY (Abdomen)     Patient location during evaluation: PACU Anesthesia Type: General Level of consciousness: awake and alert Pain management: pain level controlled Vital Signs Assessment: post-procedure vital signs reviewed and stable Respiratory status: spontaneous breathing, nonlabored ventilation, respiratory function stable and patient connected to nasal cannula oxygen Cardiovascular status: blood pressure returned to baseline and stable Postop Assessment: no apparent nausea or vomiting Anesthetic complications: no  No notable events documented.  Last Vitals:  Vitals:   01/18/24 1315 01/18/24 1341  BP: 111/65 118/66  Pulse: (!) 111 (!) 109  Resp: 20 16  Temp: (!) 36.4 C 37.3 C  SpO2: 97% 100%    Last Pain:  Vitals:   01/18/24 1341  TempSrc: Oral  PainSc:                  Rosalita Combe
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
## 2024-01-18 NOTE — Anesthesia Procedure Notes (Addendum)
 Procedure Name: Intubation Date/Time: 01/18/2024 9:50 AM  Performed by: Trenton Frock, CRNAPre-anesthesia Checklist: Patient identified, Emergency Drugs available, Suction available and Patient being monitored Patient Re-evaluated:Patient Re-evaluated prior to induction Oxygen Delivery Method: Circle system utilized Preoxygenation: Pre-oxygenation with 100% oxygen Induction Type: IV induction Ventilation: Mask ventilation without difficulty Laryngoscope Size: Mac and 3 Grade View: Grade I Tube type: Oral Tube size: 7.5 mm Number of attempts: 1 Airway Equipment and Method: Stylet and Oral airway Placement Confirmation: ETT inserted through vocal cords under direct vision, positive ETCO2 and breath sounds checked- equal and bilateral Tube secured with: Tape Dental Injury: Teeth and Oropharynx as per pre-operative assessment

## 2024-01-19 ENCOUNTER — Encounter (HOSPITAL_COMMUNITY): Payer: Self-pay | Admitting: General Surgery

## 2024-01-19 LAB — BASIC METABOLIC PANEL WITH GFR
Anion gap: 8 (ref 5–15)
BUN: <5 mg/dL — ABNORMAL LOW (ref 6–20)
CO2: 25 mmol/L (ref 22–32)
Calcium: 8.9 mg/dL (ref 8.9–10.3)
Chloride: 105 mmol/L (ref 98–111)
Creatinine, Ser: 0.74 mg/dL (ref 0.61–1.24)
GFR, Estimated: >60 mL/min (ref >60)
Glucose, Bld: 95 mg/dL (ref 70–99)
Potassium: 3.9 mmol/L (ref 3.5–5.1)
Sodium: 138 mmol/L (ref 135–145)

## 2024-01-19 LAB — CBC
HCT: 36.2 % — ABNORMAL LOW (ref 39.0–52.0)
Hemoglobin: 11.9 g/dL — ABNORMAL LOW (ref 13.0–17.0)
MCH: 30.1 pg (ref 26.0–34.0)
MCHC: 32.9 g/dL (ref 30.0–36.0)
MCV: 91.4 fL (ref 80.0–100.0)
Platelets: 388 10*3/uL (ref 150–400)
RBC: 3.96 MIL/uL — ABNORMAL LOW (ref 4.22–5.81)
RDW: 11.4 % — ABNORMAL LOW (ref 11.5–15.5)
WBC: 18.6 10*3/uL — ABNORMAL HIGH (ref 4.0–10.5)
nRBC: 0 % (ref 0.0–0.2)

## 2024-01-19 MED ORDER — HYDROMORPHONE HCL 1 MG/ML IJ SOLN
1.0000 mg | INTRAMUSCULAR | Status: DC | PRN
  Administered 2024-01-19 – 2024-01-20 (×3): 2 mg via INTRAVENOUS
  Filled 2024-01-19 (×4): qty 2

## 2024-01-19 MED ORDER — TRAZODONE HCL 100 MG PO TABS
100.0000 mg | ORAL_TABLET | Freq: Once | ORAL | Status: AC
  Administered 2024-01-19: 100 mg via ORAL
  Filled 2024-01-19: qty 1

## 2024-01-19 NOTE — Progress Notes (Addendum)
 Removed foley catheter at 0745.  Patient tolerated well.

## 2024-01-19 NOTE — Progress Notes (Signed)
 Patient ID: Trevor [name], male   DOB: 2001/06/29, 23 y.o.   MRN: [id number] 1 Day Post-Op    Subjective: Very small BM, had nausea yesterday now resolved. Quite sore. ROS negative except as listed above. Objective: Vital signs in last 24 hours: Temp:  [97.5 F (36.4 C)-99.1 F (37.3 C)] 98.6 F (37 C) (05/13 0511) Pulse Rate:  [60-111] 60 (05/13 0511) Resp:  [13-26] 17 (05/13 0511) BP: (99-121)/(51-82) 99/79 (05/13 0511) SpO2:  [97 %-100 %] 100 % (05/13 0511) Weight:  [45.4 kg] 45.4 kg (05/12 0741)    Intake/Output from previous day: 05/12 0701 - 05/13 0700 In: 2564.5 [P.O.:120; I.V.:2268; IV Piggyback:176.5] Out: 2290 [Urine:2290] Intake/Output this shift: Total I/O In: 1099.4 [P.O.:120; I.V.:979.4] Out: 650 [Urine:650]  General appearance: alert and cooperative GI: soft, dry stain on dressings  Lab Results: CBC  No results for input(s): "WBC", "HGB", "HCT", "PLT" in the last 72 hours. BMET No results for input(s): "NA", "K", "CL", "CO2", "GLUCOSE", "BUN", "CREATININE", "CALCIUM" in the last 72 hours. PT/INR No results for input(s): "LABPROT", "INR" in the last 72 hours. ABG No results for input(s): "PHART", "HCO3" in the last 72 hours.  Invalid input(s): "PCO2", "PO2"  Studies/Results: No results found.  Anti-infectives: Anti-infectives (From admission, onward)    Start     Dose/Rate Route Frequency Ordered Stop   01/18/24 0800  cefoTEtan (CEFOTAN) 2 g in sodium chloride  0.9 % 100 mL IVPB        2 g 200 mL/hr over 30 Minutes Intravenous On call to O.R. 01/18/24 0745 01/18/24 0945       Assessment/Plan: S/P LOA and colostomy takedown 5/13 - clears - decrease IVF to 50/h - labs are pending - adjust pain meds VTE - LMWH Dispo - 6N, AROBF  I spoke with his mother at the bedside.   LOS: 1 day    Dorena Gander, MD, MPH, FACS Trauma & General Surgery Use [email or web address] to contact on call provider  01/19/2024

## 2024-01-19 NOTE — Plan of Care (Signed)
  Problem: Education: Goal: Knowledge of General Education information will improve Description: Including pain rating scale, medication(s)/side effects and non-pharmacologic comfort measures Outcome: Progressing   Problem: Clinical Measurements: Goal: Will remain free from infection Outcome: Progressing   Problem: Pain Managment: Goal: General experience of comfort will improve and/or be controlled Outcome: Progressing   Problem: Safety: Goal: Ability to remain free from injury will improve Outcome: Progressing

## 2024-01-20 LAB — CBC
HCT: 34.7 % — ABNORMAL LOW (ref 39.0–52.0)
Hemoglobin: 11.4 g/dL — ABNORMAL LOW (ref 13.0–17.0)
MCH: 30.2 pg (ref 26.0–34.0)
MCHC: 32.9 g/dL (ref 30.0–36.0)
MCV: 91.8 fL (ref 80.0–100.0)
Platelets: 356 10*3/uL (ref 150–400)
RBC: 3.78 MIL/uL — ABNORMAL LOW (ref 4.22–5.81)
RDW: 11.6 % (ref 11.5–15.5)
WBC: 9.1 10*3/uL (ref 4.0–10.5)
nRBC: 0 % (ref 0.0–0.2)

## 2024-01-20 LAB — BASIC METABOLIC PANEL WITH GFR
Anion gap: 7 (ref 5–15)
BUN: <5 mg/dL — ABNORMAL LOW (ref 6–20)
CO2: 26 mmol/L (ref 22–32)
Calcium: 8.8 mg/dL — ABNORMAL LOW (ref 8.9–10.3)
Chloride: 106 mmol/L (ref 98–111)
Creatinine, Ser: 0.76 mg/dL (ref 0.61–1.24)
GFR, Estimated: >60 mL/min (ref >60)
Glucose, Bld: 92 mg/dL (ref 70–99)
Potassium: 3.5 mmol/L (ref 3.5–5.1)
Sodium: 139 mmol/L (ref 135–145)

## 2024-01-20 MED ORDER — HYDROMORPHONE HCL 1 MG/ML IJ SOLN
1.0000 mg | INTRAMUSCULAR | Status: DC | PRN
  Administered 2024-01-20 – 2024-01-22 (×2): 1 mg via INTRAVENOUS
  Filled 2024-01-20 (×2): qty 1

## 2024-01-20 NOTE — Progress Notes (Signed)
 Mobility Specialist Progress Note:    01/20/24 1540  Mobility  Activity Ambulated with assistance in hallway  Level of Assistance Standby assist, set-up cues, supervision of patient - no hands on  Assistive Device None  Distance Ambulated (ft) 550 ft  Activity Response Tolerated well  Mobility Referral Yes  Mobility visit 1 Mobility  Mobility Specialist Start Time (ACUTE ONLY) 1510  Mobility Specialist Stop Time (ACUTE ONLY) 1522  Mobility Specialist Time Calculation (min) (ACUTE ONLY) 12 min   Received pt in bed having no complaints and agreeable to mobility. Pt was asymptomatic throughout ambulation and returned to room w/o fault. Left in chair w/ call bell in reach and all needs met.   Inetta Manes Mobility Specialist  Please contact vis Secure Chat or  Rehab Office [phone]
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
## 2024-01-20 NOTE — Plan of Care (Signed)

## 2024-01-20 NOTE — Plan of Care (Signed)
   Problem: Elimination: Goal: Will not experience complications related to bowel motility Outcome: Progressing   Problem: Pain Managment: Goal: General experience of comfort will improve and/or be controlled Outcome: Progressing   Problem: Safety: Goal: Ability to remain free from injury will improve Outcome: Progressing

## 2024-01-21 MED ORDER — SIMETHICONE 40 MG/0.6ML PO SUSP: 40.0000 mg | Freq: Four times a day (QID) | ORAL | Status: DC | PRN

## 2024-01-21 MED ORDER — ENSURE ENLIVE PO LIQD
237.0000 mL | Freq: Two times a day (BID) | ORAL | Status: DC
  Administered 2024-01-21: 237 mL via ORAL

## 2024-01-21 MED ORDER — PANTOPRAZOLE SODIUM 40 MG PO TBEC
40.0000 mg | DELAYED_RELEASE_TABLET | Freq: Every day | ORAL | Status: DC
  Administered 2024-01-21: 40 mg via ORAL
  Filled 2024-01-21: qty 1

## 2024-01-21 NOTE — Progress Notes (Addendum)
 Patient complained of left side rib cramping pain  level 9.MD made aware.

## 2024-01-21 NOTE — Progress Notes (Signed)
 Mobility Specialist Progress Note:    01/21/24 1540  Mobility  Activity Ambulated with assistance in hallway  Level of Assistance Standby assist, set-up cues, supervision of patient - no hands on  Assistive Device None  Distance Ambulated (ft) 600 ft  Activity Response Tolerated well  Mobility Referral Yes  Mobility visit 1 Mobility  Mobility Specialist Start Time (ACUTE ONLY) 1500  Mobility Specialist Stop Time (ACUTE ONLY) 1515  Mobility Specialist Time Calculation (min) (ACUTE ONLY) 15 min   Received pt in chair having no complaints and agreeable to mobility. Pt was asymptomatic throughout ambulation and returned to room w/o fault. Left in chair w/ call bell in reach and all needs met. Family in room.   Inetta Manes Mobility Specialist  Please contact vis Secure Chat or  Rehab Office [phone]
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
## 2024-01-21 NOTE — Plan of Care (Signed)

## 2024-01-22 MED ORDER — OXYCODONE HCL 5 MG PO TABS: 5.0000 mg | ORAL_TABLET | Freq: Four times a day (QID) | ORAL | 0 refills | Status: AC | PRN

## 2024-01-22 MED ORDER — POLYETHYLENE GLYCOL 3350 17 G PO PACK: 17.0000 g | PACK | Freq: Every day | ORAL | Status: DC
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
## 2024-08-12 ENCOUNTER — Encounter (HOSPITAL_BASED_OUTPATIENT_CLINIC_OR_DEPARTMENT_OTHER): Payer: Self-pay

## 2024-08-12 ENCOUNTER — Emergency Department (HOSPITAL_BASED_OUTPATIENT_CLINIC_OR_DEPARTMENT_OTHER)
Admission: EM | Admit: 2024-08-12 | Discharge: 2024-08-12 | Disposition: A | Discharge status: Home / Self Care | Payer: MEDICAID | Attending: Emergency Medicine | Admitting: Emergency Medicine

## 2024-08-12 ENCOUNTER — Other Ambulatory Visit: Payer: Self-pay

## 2024-08-12 DIAGNOSIS — R1084 Generalized abdominal pain: Secondary | ICD-10-CM | POA: Insufficient documentation

## 2024-08-12 DIAGNOSIS — J101 Influenza due to other identified influenza virus with other respiratory manifestations: Secondary | ICD-10-CM | POA: Insufficient documentation

## 2024-08-12 LAB — RESP PANEL BY RT-PCR (RSV, FLU A&B, COVID)  RVPGX2
Influenza A by PCR: POSITIVE — AB
Influenza B by PCR: NEGATIVE
Resp Syncytial Virus by PCR: NEGATIVE
SARS Coronavirus 2 by RT PCR: NEGATIVE

## 2024-08-12 MED ORDER — ONDANSETRON HCL 4 MG PO TABS: 4.0000 mg | ORAL_TABLET | Freq: Four times a day (QID) | ORAL | 0 refills | Status: AC

## 2024-08-12 MED ORDER — ONDANSETRON 4 MG PO TBDP
4.0000 mg | ORAL_TABLET | Freq: Once | ORAL | Status: AC
  Administered 2024-08-12: 4 mg via ORAL
  Filled 2024-08-12: qty 1

## 2024-08-12 MED ORDER — ACETAMINOPHEN 325 MG PO TABS
650.0000 mg | ORAL_TABLET | Freq: Once | ORAL | Status: AC | PRN
  Administered 2024-08-12: 650 mg via ORAL
  Filled 2024-08-12: qty 2

## 2024-08-12 MED ORDER — IBUPROFEN 400 MG PO TABS
400.0000 mg | ORAL_TABLET | Freq: Once | ORAL | Status: AC
  Administered 2024-08-12: 400 mg via ORAL
  Filled 2024-08-12: qty 1

## 2024-08-12 NOTE — ED Notes (Signed)
 Pt tolerated PO fluids

## 2024-08-12 NOTE — Discharge Instructions (Addendum)
 You were seen today for influenza A.  Continue to hydrate, eating small meals ideally having things like toast, applesauce, soups and small amounts to help decrease stomach upset but help with bodyaches.  Additionally continue to hydrate throughout the course of the day.  You can take Tylenol  and ibuprofen  as needed for body aches and chills and fever.  Take Tylenol  (acetominophen)  650mg  every 4-6 hours, as needed for pain or fever. Do not take more than 4,000 mg in a 24-hour period. As this may cause liver damage. While this is rare, if you begin to develop yellowing of the skin or eyes, stop taking and return to ER immediately.  Take Ibuprofen  400mg  every 4-6 hours for pain or fever, not exceeding 3,200 mg per day as more than 3,200mg  can cause Stomach irritation, dizziness, kidney issues with long-term use.  Be sure to establish care with a primary care provider to see for any persistent symptoms and for long-term health management, return to the ED for any new or worsening symptoms include persistent vomiting despite nausea medication, uncontrolled pain, confusion, shortness of breath.

## 2024-08-12 NOTE — ED Triage Notes (Signed)
 Pt reports cough started yesterday. Vomiting during the night. NO diarrhea Fever and chills. Generalized weakness Headache and stomach hurts
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
## 2024-08-13 ENCOUNTER — Encounter (HOSPITAL_BASED_OUTPATIENT_CLINIC_OR_DEPARTMENT_OTHER): Payer: Self-pay | Admitting: Urology

## 2024-08-13 ENCOUNTER — Emergency Department (HOSPITAL_BASED_OUTPATIENT_CLINIC_OR_DEPARTMENT_OTHER)
Admission: EM | Admit: 2024-08-13 | Discharge: 2024-08-14 | Disposition: A | Payer: MEDICAID | Attending: Emergency Medicine | Admitting: Emergency Medicine

## 2024-08-13 DIAGNOSIS — R1114 Bilious vomiting: Secondary | ICD-10-CM

## 2024-08-13 DIAGNOSIS — J111 Influenza due to unidentified influenza virus with other respiratory manifestations: Secondary | ICD-10-CM

## 2024-08-13 MED ORDER — ONDANSETRON HCL 4 MG/2ML IJ SOLN
4.0000 mg | Freq: Once | INTRAMUSCULAR | Status: AC
  Administered 2024-08-13: 4 mg via INTRAVENOUS
  Filled 2024-08-13: qty 2

## 2024-08-13 MED ORDER — LACTATED RINGERS IV BOLUS
1000.0000 mL | Freq: Once | INTRAVENOUS | Status: AC
  Administered 2024-08-13: 1000 mL via INTRAVENOUS

## 2024-08-13 NOTE — ED Provider Notes (Incomplete)
 [place] [facility] Provider Note   CSN: [id number] Arrival date & time: 08/13/24  2158     Patient presents with: Influenza   Trevor [name] is a 23 y.o. male.   Patient is here for evaluation of continued nausea and vomiting.  Patient was here for same yesterday and diagnosed with influenza.  He was sent home with a prescription for Zofran  however he has not picked up this medication.  He states he has had about half a dozen episodes of vomiting today and has been unable to keep down oral medications such as Tylenol .  Reports diffuse abdominal pain.  Denies syncopal episodes or shortness of breath.  He is unsure if he has had fevers.  Last bowel movement was 2 days ago which is normal for him.  He has been urinating normally and denies any dysuria, hematuria, or foul-smelling urine.  Last episode of emesis 15 to 20 minutes ago.  Patient is requesting Zofran  and ice water.  The history is provided by the patient.  Influenza Presenting symptoms: nausea and vomiting        Prior to Admission medications   Medication Sig Start Date End Date Taking? Authorizing Provider  acetaminophen  (TYLENOL ) 500 MG tablet Take 2 tablets (1,000 mg total) by mouth every 6 (six) hours as needed. 08/17/23   Tammy Sor, PA-C  ibuprofen  (ADVIL ) 200 MG tablet Take 800 mg by mouth every 8 (eight) hours as needed (pain.).    [provider]  methocarbamol  (ROBAXIN ) 500 MG tablet Take 500 mg by mouth every 8 (eight) hours as needed for muscle spasms.    [provider]  ondansetron  (ZOFRAN ) 4 MG tablet Take 1 tablet (4 mg total) by mouth every 8 (eight) hours as needed for nausea or vomiting. 10/12/23   Towana Ozell BROCKS, MD  ondansetron  (ZOFRAN ) 4 MG tablet Take 1 tablet (4 mg total) by mouth every 6 (six) hours. 08/12/24   Bauer, Collin S, PA-C  oxyCODONE  (OXY IR/ROXICODONE ) 5 MG immediate release tablet Take 1-2 tablets (5-10 mg total) by mouth every 6  (six) hours as needed (5mg  moderate, 10mg  severe pain). 01/22/24   Tammy Sor, PA-C    Allergies: Patient has no known allergies.    Review of Systems  Gastrointestinal:  Positive for nausea and vomiting.    Updated Vital Signs BP 122/86 (BP Location: Right Arm)   Pulse 88   Temp 98.2 F (36.8 C) (Oral)   Resp 18   Ht 5' 3 (1.6 m)   Wt 45.4 kg   SpO2 100%   BMI 17.73 kg/m   Physical Exam Vitals and nursing note reviewed.  Constitutional:      General: He is not in acute distress.    Appearance: Normal appearance. He is ill-appearing. He is not toxic-appearing or diaphoretic.  HENT:     Head: Normocephalic and atraumatic.     Nose: Congestion present.     Mouth/Throat:     Mouth: Mucous membranes are moist.  Eyes:     General: No scleral icterus.    Extraocular Movements: Extraocular movements intact.     Conjunctiva/sclera: Conjunctivae normal.  Cardiovascular:     Rate and Rhythm: Normal rate and regular rhythm.     Pulses: Normal pulses.     Heart sounds: Normal heart sounds.  Pulmonary:     Effort: Pulmonary effort is normal. No respiratory distress.     Breath sounds: Normal breath sounds. No stridor. No wheezing, rhonchi  or rales.  Abdominal:     General: Abdomen is flat. Bowel sounds are normal. There is no distension.     Palpations: Abdomen is soft.     Tenderness: There is generalized abdominal tenderness. There is guarding. Negative signs include Murphy's sign, Rovsing's sign and McBurney's sign.  Musculoskeletal:        General: Normal range of motion.     Cervical back: Normal range of motion.  Skin:    General: Skin is warm and dry.     Capillary Refill: Capillary refill takes less than 2 seconds.     Coloration: Skin is not jaundiced or pale.  Neurological:     Mental Status: He is alert and oriented to person, place, and time.     (all labs ordered are listed, but only abnormal results are displayed) Labs Reviewed - No data to  display  EKG: None  Radiology: No results found.   Procedures   Medications Ordered in the ED  ondansetron  (ZOFRAN ) injection 4 mg (4 mg Intravenous Given 08/13/24 2335)  lactated ringers  bolus 1,000 mL (1,000 mLs Intravenous New Bag/Given 08/13/24 2335)     Patient presents to the ED for concern of nausea and vomiting, this involves an extensive number of treatment options, and is a complaint that carries with it a high risk of complications and morbidity.  The differential diagnosis includes influenza, biliary disease, appendicitis, bowel obstruction, foodborne illness, gastroenteritis.  While patient does have a history of abdominal surgery considering the setting of known influenza symptoms are likely related to his current illness versus abdominal adhesions/bowel obstruction.   Co morbidities that complicate the patient evaluation  Prior history of colostomy takedown   Medicines ordered and prescription drug management:  I ordered medication including Zofran  and lactated ringer  bolus for nausea and vomiting Reevaluation of the patient after these medicines showed that the patient improved   Test Considered:  ***   Problem List / ED Course:  Nausea and vomiting.***   Reevaluation:  After the interventions noted above, I reevaluated the patient and found that they have :{resolved/improved/worsened:23923::improved}   Dispostion:  After consideration of the diagnostic results and the patients response to treatment, I feel that the patent would benefit from ***.    Medical Decision Making Risk Prescription drug management.   This note was produced using Electronics Engineer. While the provider has reviewed and verified all clinical information, transcription errors may remain.    Final diagnoses:  None    ED Discharge Orders     None

## 2024-08-13 NOTE — ED Provider Notes (Signed)
 [facility] Provider Note   CSN: [id number] Arrival date & time: 08/13/24  2158     Patient presents with: Influenza   Trevor [name] is a 23 y.o. male.   Patient is here for evaluation of***   Influenza      Prior to Admission medications   Medication Sig Start Date End Date Taking? Authorizing Provider  acetaminophen  (TYLENOL ) 500 MG tablet Take 2 tablets (1,000 mg total) by mouth every 6 (six) hours as needed. 08/17/23   Tammy Sor, PA-C  ibuprofen  (ADVIL ) 200 MG tablet Take 800 mg by mouth every 8 (eight) hours as needed (pain.).    [provider]  methocarbamol  (ROBAXIN ) 500 MG tablet Take 500 mg by mouth every 8 (eight) hours as needed for muscle spasms.    [provider]  ondansetron  (ZOFRAN ) 4 MG tablet Take 1 tablet (4 mg total) by mouth every 8 (eight) hours as needed for nausea or vomiting. 10/12/23   Towana Ozell BROCKS, MD  ondansetron  (ZOFRAN ) 4 MG tablet Take 1 tablet (4 mg total) by mouth every 6 (six) hours. 08/12/24   Bauer, Collin S, PA-C  oxyCODONE  (OXY IR/ROXICODONE ) 5 MG immediate release tablet Take 1-2 tablets (5-10 mg total) by mouth every 6 (six) hours as needed (5mg  moderate, 10mg  severe pain). 01/22/24   Tammy Sor, PA-C    Allergies: Patient has no known allergies.    Review of Systems  Updated Vital Signs BP 122/86 (BP Location: Right Arm)   Pulse 88   Temp 98.2 F (36.8 C) (Oral)   Resp 18   Ht 5' 3 (1.6 m)   Wt 45.4 kg   SpO2 100%   BMI 17.73 kg/m   Physical Exam  (all labs ordered are listed, but only abnormal results are displayed) Labs Reviewed - No data to display  EKG: None  Radiology: No results found.   Procedures   Medications Ordered in the ED - No data to display    Patient presents to the ED for concern of ***, this involves an extensive number of treatment options, and is a complaint that carries with it a high risk of complications and  morbidity.  The differential diagnosis includes ***   Co morbidities that complicate the patient evaluation  ***   Additional history obtained:  Additional history obtained from *** {Blank multiple:19196::EMS,Family,Nursing,Outside Medical Records,Past Admission}   External records from outside source obtained and reviewed including ***   Lab Tests:  I Ordered, and personally interpreted labs.  The pertinent results include:  ***   Imaging Studies ordered:  I ordered imaging studies including ***  I independently visualized and interpreted imaging which showed *** I agree with the radiologist interpretation   Cardiac Monitoring:  The patient was maintained on a cardiac monitor.  I personally viewed and interpreted the cardiac monitored which showed an underlying rhythm of: ***   Medicines ordered and prescription drug management:  I ordered medication including ***  for ***  Reevaluation of the patient after these medicines showed that the patient {resolved/improved/worsened:23923::improved} I have reviewed the patients home medicines and have made adjustments as needed   Test Considered:  ***   Critical Interventions:  ***   Consultations Obtained:  I requested consultation with the ***,  and discussed lab and imaging findings as well as pertinent plan - they recommend: ***   Problem List / ED Course:  ***   Reevaluation:  After the interventions noted above, I  reevaluated the patient and found that they have :{resolved/improved/worsened:23923::improved}   Social Determinants of Health:  ***   Dispostion:  After consideration of the diagnostic results and the patients response to treatment, I feel that the patent would benefit from ***.   Medical Decision Making  This note was produced using Dragon Medical voice recognition. While the provider has reviewed and verified all clinical information, transcription errors may remain.     Final diagnoses:  None    ED Discharge Orders     None

## 2024-08-13 NOTE — ED Triage Notes (Signed)
 Pt diagnosed with flu yesterday  States continued pain, fever, and emesis  Unable to tolerate fluids   Had Rx for zofran  but didn't pick it up

## 2024-08-14 MED ORDER — ACETAMINOPHEN 500 MG PO TABS
1000.0000 mg | ORAL_TABLET | Freq: Once | ORAL | Status: AC
  Administered 2024-08-14: 1000 mg via ORAL
  Filled 2024-08-14: qty 2

## 2024-08-14 NOTE — ED Notes (Signed)
 ED Provider at bedside.

## 2024-08-14 NOTE — ED Notes (Signed)
 Pt still sleeping in bed with lights off on side at this time.  No obvious distress or vomiting noted since assuming care. Will continue to monitor

## 2024-08-14 NOTE — ED Notes (Signed)
 Pt sleeping, awoken for med pass.  Pt requested blanket and returned to sleep at this time.

## 2024-08-14 NOTE — Discharge Instructions (Signed)
 It was a pleasure meeting with you today.  As we discussed your respiratory panel yesterday was positive for influenza A.  It is reassuring that your symptoms improved with the antinausea medication, fluids, and Tylenol  given today.  Continue to rest while your body heals.  Pick up yesterday's prescribed Zofran  from your pharmacy for continued management of nausea/vomiting.  You can use over-the-counter flu medication once as well as Tylenol  for symptoms.  Follow-up with primary care for ongoing evaluation of improvement of symptoms.

## 2024-08-14 NOTE — ED Notes (Signed)
 Pt given PO food and drink.  Pt took one sip of drink and has chosen not to try cracker.  Pt is using cell phone at this time.

## 2024-08-14 NOTE — ED Notes (Signed)
 Arrived to room to medicate pt.  He was asleep and non cooperative in care.  IVF started and zofran  given.  Will continue to monitor

## 2024-09-01 ENCOUNTER — Encounter (HOSPITAL_COMMUNITY): Payer: Self-pay | Admitting: Emergency Medicine

## 2024-09-01 ENCOUNTER — Emergency Department (HOSPITAL_COMMUNITY)
Admission: EM | Admit: 2024-09-01 | Discharge: 2024-09-01 | Discharge status: Against Medical Advice | Payer: MEDICAID | Attending: Emergency Medicine | Admitting: Emergency Medicine

## 2024-09-01 ENCOUNTER — Other Ambulatory Visit: Payer: Self-pay

## 2024-09-01 DIAGNOSIS — R109 Unspecified abdominal pain: Secondary | ICD-10-CM | POA: Insufficient documentation

## 2024-09-01 DIAGNOSIS — Z5321 Procedure and treatment not carried out due to patient leaving prior to being seen by health care provider: Secondary | ICD-10-CM | POA: Diagnosis not present

## 2024-09-01 DIAGNOSIS — R1110 Vomiting, unspecified: Secondary | ICD-10-CM | POA: Diagnosis not present

## 2024-09-01 LAB — RESP PANEL BY RT-PCR (RSV, FLU A&B, COVID)  RVPGX2
Influenza A by PCR: NEGATIVE
Influenza B by PCR: NEGATIVE
Resp Syncytial Virus by PCR: NEGATIVE
SARS Coronavirus 2 by RT PCR: NEGATIVE

## 2024-09-01 LAB — COMPREHENSIVE METABOLIC PANEL WITH GFR
ALT: 20 U/L (ref 0–44)
AST: 35 U/L (ref 15–41)
Albumin: 5 g/dL (ref 3.5–5.0)
Alkaline Phosphatase: 94 U/L (ref 38–126)
Anion gap: 17 — ABNORMAL HIGH (ref 5–15)
BUN: 13 mg/dL (ref 6–20)
CO2: 29 mmol/L (ref 22–32)
Calcium: 10 mg/dL (ref 8.9–10.3)
Chloride: 97 mmol/L — ABNORMAL LOW (ref 98–111)
Creatinine, Ser: 0.9 mg/dL (ref 0.61–1.24)
GFR, Estimated: >60 mL/min
Glucose, Bld: 85 mg/dL (ref 70–99)
Potassium: 4 mmol/L (ref 3.5–5.1)
Sodium: 142 mmol/L (ref 135–145)
Total Bilirubin: 1.1 mg/dL (ref 0.0–1.2)
Total Protein: 8.4 g/dL — ABNORMAL HIGH (ref 6.5–8.1)

## 2024-09-01 LAB — CBC
HCT: 41.3 % (ref 39.0–52.0)
Hemoglobin: 14.3 g/dL (ref 13.0–17.0)
MCH: 30 pg (ref 26.0–34.0)
MCHC: 34.6 g/dL (ref 30.0–36.0)
MCV: 86.8 fL (ref 80.0–100.0)
Platelets: 614 K/uL — ABNORMAL HIGH (ref 150–400)
RBC: 4.76 MIL/uL (ref 4.22–5.81)
RDW: 11.6 % (ref 11.5–15.5)
WBC: 18.4 K/uL — ABNORMAL HIGH (ref 4.0–10.5)
nRBC: 0 % (ref 0.0–0.2)

## 2024-09-01 LAB — LIPASE, BLOOD: Lipase: 12 U/L (ref 11–51)

## 2024-09-01 NOTE — ED Notes (Signed)
 Pt left d/t wait time, pt seen leaving ED.

## 2024-09-01 NOTE — ED Triage Notes (Signed)
 Patient reports multiple hematemesis onset today with intermittent pain across abdomen , no diarrhea or fever .

## 2024-10-11 ENCOUNTER — Ambulatory Visit: Payer: MEDICAID

## 2024-10-11 DIAGNOSIS — Z8481 Family history of carrier of genetic disease: Secondary | ICD-10-CM

## 2024-10-11 DIAGNOSIS — Z31440 Encounter of male for testing for genetic disease carrier status for procreative management: Secondary | ICD-10-CM
